# Patient Record
Sex: Female | Born: 1955 | Race: White | Hispanic: No | Smoking: Never smoker
Health system: Southern US, Community
[De-identification: ages and names within clinical notes are randomized; demographics above are authoritative.]

## PROBLEM LIST (undated history)

## (undated) DIAGNOSIS — F32A Depression, unspecified: Secondary | ICD-10-CM

## (undated) DIAGNOSIS — E739 Lactose intolerance, unspecified: Secondary | ICD-10-CM

## (undated) DIAGNOSIS — I10 Essential (primary) hypertension: Secondary | ICD-10-CM

## (undated) DIAGNOSIS — R011 Cardiac murmur, unspecified: Secondary | ICD-10-CM

## (undated) DIAGNOSIS — J45909 Unspecified asthma, uncomplicated: Secondary | ICD-10-CM

## (undated) DIAGNOSIS — F319 Bipolar disorder, unspecified: Secondary | ICD-10-CM

## (undated) DIAGNOSIS — G5762 Lesion of plantar nerve, left lower limb: Secondary | ICD-10-CM

## (undated) DIAGNOSIS — Z9109 Other allergy status, other than to drugs and biological substances: Secondary | ICD-10-CM

## (undated) DIAGNOSIS — F329 Major depressive disorder, single episode, unspecified: Secondary | ICD-10-CM

## (undated) HISTORY — DX: Unspecified asthma, uncomplicated: J45.909

## (undated) HISTORY — DX: Major depressive disorder, single episode, unspecified: F32.9

## (undated) HISTORY — DX: Cardiac murmur, unspecified: R01.1

## (undated) HISTORY — DX: Lactose intolerance, unspecified: E73.9

## (undated) HISTORY — DX: Depression, unspecified: F32.A

## (undated) HISTORY — DX: Essential (primary) hypertension: I10

## (undated) HISTORY — DX: Other allergy status, other than to drugs and biological substances: Z91.09

## (undated) HISTORY — DX: Bipolar disorder, unspecified: F31.9

## (undated) HISTORY — DX: Lesion of plantar nerve, left lower limb: G57.62

---

## 2004-05-23 ENCOUNTER — Emergency Department (HOSPITAL_COMMUNITY): Admission: EM | Admit: 2004-05-23 | Discharge: 2004-05-23 | Payer: Self-pay | Admitting: Emergency Medicine

## 2004-06-25 ENCOUNTER — Emergency Department (HOSPITAL_COMMUNITY): Admission: EM | Admit: 2004-06-25 | Discharge: 2004-06-26 | Payer: Self-pay | Admitting: Emergency Medicine

## 2004-11-13 ENCOUNTER — Emergency Department (HOSPITAL_COMMUNITY): Admission: EM | Admit: 2004-11-13 | Discharge: 2004-11-13 | Payer: Self-pay | Admitting: Emergency Medicine

## 2004-12-17 ENCOUNTER — Emergency Department (HOSPITAL_COMMUNITY): Admission: EM | Admit: 2004-12-17 | Discharge: 2004-12-18 | Payer: Self-pay | Admitting: Emergency Medicine

## 2005-08-14 ENCOUNTER — Ambulatory Visit (HOSPITAL_COMMUNITY): Admission: RE | Admit: 2005-08-14 | Discharge: 2005-08-14 | Payer: Self-pay | Admitting: Family Medicine

## 2005-10-13 ENCOUNTER — Ambulatory Visit (HOSPITAL_COMMUNITY): Admission: RE | Admit: 2005-10-13 | Discharge: 2005-10-13 | Payer: Self-pay | Admitting: Family Medicine

## 2007-03-06 ENCOUNTER — Ambulatory Visit (HOSPITAL_COMMUNITY): Admission: RE | Admit: 2007-03-06 | Discharge: 2007-03-06 | Payer: Self-pay | Admitting: Podiatry

## 2007-03-06 ENCOUNTER — Encounter (INDEPENDENT_AMBULATORY_CARE_PROVIDER_SITE_OTHER): Payer: Self-pay | Admitting: Podiatry

## 2009-08-10 ENCOUNTER — Ambulatory Visit (HOSPITAL_COMMUNITY): Admission: RE | Admit: 2009-08-10 | Discharge: 2009-08-10 | Payer: Self-pay | Admitting: Family Medicine

## 2009-09-09 DIAGNOSIS — R05 Cough: Secondary | ICD-10-CM

## 2009-09-09 DIAGNOSIS — F319 Bipolar disorder, unspecified: Secondary | ICD-10-CM | POA: Insufficient documentation

## 2009-09-09 DIAGNOSIS — R059 Cough, unspecified: Secondary | ICD-10-CM | POA: Insufficient documentation

## 2009-09-09 DIAGNOSIS — J309 Allergic rhinitis, unspecified: Secondary | ICD-10-CM | POA: Insufficient documentation

## 2009-10-06 ENCOUNTER — Telehealth: Payer: Self-pay | Admitting: Internal Medicine

## 2010-03-27 HISTORY — PX: LAMINECTOMY: SHX219

## 2010-04-26 NOTE — Progress Notes (Signed)
Summary: nos appt  Phone Note Call from Patient   Caller: [email or web address]  Call For: Yamilee Harmes Summary of Call: LMTCB x2 to rsc nos from 7/12. Initial call taken by: Darletta Moll,  October 06, 2009 3:42 PM

## 2010-06-01 ENCOUNTER — Other Ambulatory Visit (HOSPITAL_COMMUNITY): Payer: Self-pay | Admitting: Orthopaedic Surgery

## 2010-06-01 DIAGNOSIS — M545 Low back pain, unspecified: Secondary | ICD-10-CM

## 2010-06-01 DIAGNOSIS — M1990 Unspecified osteoarthritis, unspecified site: Secondary | ICD-10-CM

## 2010-06-02 ENCOUNTER — Ambulatory Visit (HOSPITAL_COMMUNITY)
Admission: RE | Admit: 2010-06-02 | Discharge: 2010-06-02 | Disposition: A | Discharge status: Home / Self Care | Payer: BC Managed Care – PPO | Source: Ambulatory Visit | Attending: Orthopaedic Surgery | Admitting: Orthopaedic Surgery

## 2010-06-02 DIAGNOSIS — M545 Low back pain, unspecified: Secondary | ICD-10-CM

## 2010-06-02 DIAGNOSIS — M5126 Other intervertebral disc displacement, lumbar region: Secondary | ICD-10-CM | POA: Insufficient documentation

## 2010-06-02 DIAGNOSIS — R209 Unspecified disturbances of skin sensation: Secondary | ICD-10-CM | POA: Insufficient documentation

## 2010-06-02 DIAGNOSIS — M1990 Unspecified osteoarthritis, unspecified site: Secondary | ICD-10-CM

## 2010-06-14 ENCOUNTER — Other Ambulatory Visit: Payer: Self-pay | Admitting: Neurosurgery

## 2010-06-14 DIAGNOSIS — M5126 Other intervertebral disc displacement, lumbar region: Secondary | ICD-10-CM

## 2010-06-14 DIAGNOSIS — M5416 Radiculopathy, lumbar region: Secondary | ICD-10-CM

## 2010-06-16 ENCOUNTER — Other Ambulatory Visit: Payer: BC Managed Care – PPO

## 2010-08-09 NOTE — Op Note (Signed)
[name], [name]             ACCOUNT NO.:  [id number]   MEDICAL RECORD NO.:  [id number]          PATIENT TYPE:  AMB   LOCATION:  DAY                           FACILITY:  [facility]   PHYSICIAN:  Cody M. Ulice Brilliant, D.P.M.  DATE OF BIRTH:  [birth year]   DATE OF PROCEDURE:  03/06/2007  DATE OF DISCHARGE:                               OPERATIVE REPORT   PREOPERATIVE DIAGNOSIS:  Morton's neuroma, third web space, left foot.   POSTOPERATIVE DIAGNOSIS:  Morton's neuroma, third web space, left foot.   PROCEDURE PERFORMED:  Excision of neuroma, third web space, left foot.   SURGEON:  Denny Peon. Ulice Brilliant, D.P.M.   ANESTHESIA:  Monitored anesthesia care.   INDICATIONS FOR SURGERY:  Painful, longstanding neuroma symptoms  originating from the third web space to the left foot, which has been  treated conservatively with injection therapy and increasing in the  width of the shoe gear.  She relates constant discomfort in this area  exacerbated by long periods of standing and walking.   DESCRIPTION OF PROCEDURE:  [name] is brought into the OR and placed on  the table in a supine position.  IV sedation is established.  A  posterior tibial nerve block is then performed about the left ankle.  Further local anesthesia is administered dorsal in the web spaces of  this left foot.  A pneumatic ankle tourniquet is then applied across her  left ankle.  Her foot is then prepped and draped in the usual aseptic  fashion.  An Ace bandage is utilized to exsanguinate the left foot.  The  tourniquet is then inflated to 250 mmHg.   PROCEDURE:  Excision of neuroma, third web space left foot.  Attention  is directed to this third web space at the left foot.  A 5 cm dorsal  linear skin incision is made from this web space, extending over the  dorsal aspect of the foot proximal.  The incision is deepened by a sharp  and blunt dissection through subcutaneous tissues into the third web  space.  The deep transverse and the  metatarsal ligament is identified,  isolated, and severed, utilizing Metzenbaum scissors.  The neuroma at  this point is well visualized.  It's digital branches are traced out  onto the medial aspect of the fourth toe and the lateral aspect of the  third toe.  These are then transected to this level.  The neuroma is  then undermined, retracted proximally, and then delivered from the wound  proximal to the metatarsal heads.  The wound is flushed with copious  amounts of irrigant.  Subcutaneous tissues are reapproximated and closed  with 4-0 Vicryl.  Skin is then reapproximated and closed in a  subcuticular fashion with 4-0 Vicryl.  Steri-Strips are applied across  the wound.  A postoperative injection of Marcaine and Hexadrol is  dispensed.  A Betadine-soaked Adaptic dressing and a dry, sterile  compressive dressing follows.   Ms. Behan tolerates the anesthesia and procedure well.  She is  transported to day hospital following deflation of the tourniquet and  placing her on the stretcher.  She  will be seen in six days for first  postop visit.  Instructions are given on ice and elevation.  A  prescription for Lorcet Plus and Phenergan is dispensed.           ______________________________  Denny Peon. Ulice Brilliant, D.P.M.     CMD/MEDQ  D:  03/06/2007  T:  03/06/2007  Job:  [id number]

## 2010-08-09 NOTE — H&P (Signed)
Valerie [name], Valerie [name]             ACCOUNT NO.:  [id number]   MEDICAL RECORD NO.:  [id number]          PATIENT TYPE:  AMB   LOCATION:  DAY                           FACILITY:  [facility]   PHYSICIAN:  Cody M. Ulice Brilliant, D.P.M.  DATE OF BIRTH:  1955/12/19   DATE OF ADMISSION:  03/06/2007  DATE OF DISCHARGE:  [facility]                              HISTORY & PHYSICAL   Greater than 2-year history of uncomfortable left forefoot.  The patient  has been treated conservatively with injection therapy in the third web  space of the left foot for the diagnosis of Morton's neuroma.  The  patient will get better with the injection therapy, only to have it  return several months later.  She is at the point now where all enclosed  shoes except for her widest shoes are uncomfortable.  She relates that  she is limited on how long she can walk or work because of pain  radiating into the third and fourth digits.   PAST MEDICAL HISTORY:  Essentially significant for anxiety.  She is on  Xanax at this point.   She has an allergy to DEPO-MEDROL, PENICILLINS.   She is a nonsmoker, occasional alcohol drinker.  She works as a Automotive engineer.   OBJECTIVE:  A very slight white female, noted to have discomfort to deep  palpation to the third web space of this left foot.   ASSESSMENT:  Neuroma, third web space left foot.   PLAN:  Surgical correction will consist of an excision of this neuroma.  I have described the procedure to Valerie [name].  She has read her consent form,  apparently understood, and signed.                                            ______________________________  Denny Peon. Ulice Brilliant, D.P.M.     CMD/MEDQ  D:  03/05/2007  T:  03/06/2007  Job:  [id number]

## 2010-08-18 ENCOUNTER — Other Ambulatory Visit (HOSPITAL_COMMUNITY): Payer: Self-pay | Admitting: Neurosurgery

## 2010-08-18 DIAGNOSIS — IMO0002 Reserved for concepts with insufficient information to code with codable children: Secondary | ICD-10-CM

## 2010-08-24 ENCOUNTER — Emergency Department (HOSPITAL_COMMUNITY)
Admission: EM | Admit: 2010-08-24 | Discharge: 2010-08-25 | Disposition: A | Discharge status: Home / Self Care | Payer: BC Managed Care – PPO | Attending: Emergency Medicine | Admitting: Emergency Medicine

## 2010-08-24 DIAGNOSIS — I10 Essential (primary) hypertension: Secondary | ICD-10-CM | POA: Insufficient documentation

## 2010-08-24 DIAGNOSIS — R51 Headache: Secondary | ICD-10-CM | POA: Insufficient documentation

## 2010-08-24 LAB — DIFFERENTIAL
Basophils Absolute: 0.1 10*3/uL (ref 0.0–0.1)
Eosinophils Relative: 1 % (ref 0–5)
Lymphs Abs: 2.5 10*3/uL (ref 0.7–4.0)
Monocytes Absolute: 0.6 10*3/uL (ref 0.1–1.0)
Monocytes Relative: 7 % (ref 3–12)
Neutrophils Relative %: 59 % (ref 43–77)

## 2010-08-24 LAB — CBC
Hemoglobin: 12.8 g/dL (ref 12.0–15.0)
MCH: 31.1 pg (ref 26.0–34.0)
MCHC: 33.3 g/dL (ref 30.0–36.0)
WBC: 7.6 10*3/uL (ref 4.0–10.5)

## 2010-08-24 LAB — BASIC METABOLIC PANEL
Chloride: 101 mEq/L (ref 96–112)
GFR calc non Af Amer: >60 mL/min (ref >60)
Glucose, Bld: 93 mg/dL (ref 70–99)
Potassium: 3.9 mEq/L (ref 3.5–5.1)

## 2010-08-25 ENCOUNTER — Ambulatory Visit (HOSPITAL_COMMUNITY)
Admission: RE | Admit: 2010-08-25 | Discharge: 2010-08-25 | Disposition: A | Discharge status: Home / Self Care | Payer: BC Managed Care – PPO | Source: Ambulatory Visit | Attending: Neurosurgery | Admitting: Neurosurgery

## 2010-08-25 DIAGNOSIS — M545 Low back pain, unspecified: Secondary | ICD-10-CM | POA: Insufficient documentation

## 2010-08-25 DIAGNOSIS — IMO0002 Reserved for concepts with insufficient information to code with codable children: Secondary | ICD-10-CM

## 2010-08-25 DIAGNOSIS — M5126 Other intervertebral disc displacement, lumbar region: Secondary | ICD-10-CM | POA: Insufficient documentation

## 2010-08-25 DIAGNOSIS — M25559 Pain in unspecified hip: Secondary | ICD-10-CM | POA: Insufficient documentation

## 2010-08-25 LAB — URINALYSIS, ROUTINE W REFLEX MICROSCOPIC
Leukocytes, UA: NEGATIVE
Nitrite: NEGATIVE
Protein, ur: NEGATIVE mg/dL

## 2010-08-25 LAB — URINE MICROSCOPIC-ADD ON

## 2010-09-09 ENCOUNTER — Other Ambulatory Visit (HOSPITAL_COMMUNITY): Payer: Self-pay | Admitting: Cardiovascular Disease

## 2010-09-09 DIAGNOSIS — R519 Headache, unspecified: Secondary | ICD-10-CM

## 2010-09-13 ENCOUNTER — Ambulatory Visit (HOSPITAL_COMMUNITY): Payer: BC Managed Care – PPO

## 2010-09-13 ENCOUNTER — Ambulatory Visit (HOSPITAL_COMMUNITY): Admission: RE | Admit: 2010-09-13 | Payer: BC Managed Care – PPO | Source: Ambulatory Visit

## 2010-10-07 ENCOUNTER — Other Ambulatory Visit (HOSPITAL_COMMUNITY): Payer: BC Managed Care – PPO

## 2010-10-07 ENCOUNTER — Ambulatory Visit (HOSPITAL_COMMUNITY)
Admission: RE | Admit: 2010-10-07 | Discharge: 2010-10-07 | Disposition: A | Discharge status: Home / Self Care | Payer: BC Managed Care – PPO | Source: Ambulatory Visit | Attending: Cardiovascular Disease | Admitting: Cardiovascular Disease

## 2010-10-07 ENCOUNTER — Ambulatory Visit (HOSPITAL_COMMUNITY): Payer: BC Managed Care – PPO

## 2010-10-07 DIAGNOSIS — R519 Headache, unspecified: Secondary | ICD-10-CM

## 2010-10-07 DIAGNOSIS — M5030 Other cervical disc degeneration, unspecified cervical region: Secondary | ICD-10-CM | POA: Insufficient documentation

## 2010-10-07 DIAGNOSIS — M542 Cervicalgia: Secondary | ICD-10-CM | POA: Insufficient documentation

## 2010-10-07 DIAGNOSIS — R51 Headache: Secondary | ICD-10-CM | POA: Insufficient documentation

## 2011-01-02 ENCOUNTER — Other Ambulatory Visit (HOSPITAL_COMMUNITY): Payer: Self-pay | Admitting: Internal Medicine

## 2011-01-02 DIAGNOSIS — Z139 Encounter for screening, unspecified: Secondary | ICD-10-CM

## 2011-01-02 LAB — BASIC METABOLIC PANEL
Calcium: 8.8
Chloride: 106
GFR calc Af Amer: >60
GFR calc non Af Amer: >60
Potassium: 3.7
Sodium: 139

## 2011-01-02 LAB — HEMOGLOBIN AND HEMATOCRIT, BLOOD: HCT: 35.6 — ABNORMAL LOW

## 2011-01-05 ENCOUNTER — Ambulatory Visit (HOSPITAL_COMMUNITY): Payer: BC Managed Care – PPO

## 2011-01-24 ENCOUNTER — Ambulatory Visit (HOSPITAL_COMMUNITY): Payer: BC Managed Care – PPO

## 2011-02-14 ENCOUNTER — Ambulatory Visit (HOSPITAL_COMMUNITY): Payer: BC Managed Care – PPO

## 2011-02-23 ENCOUNTER — Ambulatory Visit (HOSPITAL_COMMUNITY)
Admission: RE | Admit: 2011-02-23 | Discharge: 2011-02-23 | Disposition: A | Discharge status: Home / Self Care | Payer: PRIVATE HEALTH INSURANCE | Source: Ambulatory Visit | Attending: Internal Medicine | Admitting: Internal Medicine

## 2011-02-23 DIAGNOSIS — Z1231 Encounter for screening mammogram for malignant neoplasm of breast: Secondary | ICD-10-CM | POA: Insufficient documentation

## 2011-02-23 DIAGNOSIS — Z139 Encounter for screening, unspecified: Secondary | ICD-10-CM

## 2011-05-25 ENCOUNTER — Ambulatory Visit (HOSPITAL_COMMUNITY)
Admission: RE | Admit: 2011-05-25 | Discharge: 2011-05-25 | Disposition: A | Discharge status: Home / Self Care | Payer: PRIVATE HEALTH INSURANCE | Source: Ambulatory Visit | Attending: Neurosurgery | Admitting: Neurosurgery

## 2011-05-25 DIAGNOSIS — IMO0001 Reserved for inherently not codable concepts without codable children: Secondary | ICD-10-CM | POA: Insufficient documentation

## 2011-05-25 DIAGNOSIS — M545 Low back pain, unspecified: Secondary | ICD-10-CM | POA: Insufficient documentation

## 2011-05-25 DIAGNOSIS — M6281 Muscle weakness (generalized): Secondary | ICD-10-CM | POA: Insufficient documentation

## 2012-06-06 ENCOUNTER — Institutional Professional Consult (permissible substitution): Payer: PRIVATE HEALTH INSURANCE | Admitting: Pulmonary Disease

## 2012-07-08 ENCOUNTER — Encounter: Payer: Self-pay | Admitting: Pulmonary Disease

## 2012-07-09 ENCOUNTER — Institutional Professional Consult (permissible substitution): Payer: PRIVATE HEALTH INSURANCE | Admitting: Pulmonary Disease

## 2012-07-26 ENCOUNTER — Ambulatory Visit
Admission: RE | Admit: 2012-07-26 | Discharge: 2012-07-26 | Disposition: A | Discharge status: Home / Self Care | Payer: PRIVATE HEALTH INSURANCE | Source: Ambulatory Visit | Attending: Allergy and Immunology | Admitting: Allergy and Immunology

## 2012-07-26 ENCOUNTER — Other Ambulatory Visit: Payer: Self-pay | Admitting: Allergy and Immunology

## 2012-07-26 DIAGNOSIS — J45901 Unspecified asthma with (acute) exacerbation: Secondary | ICD-10-CM

## 2013-07-10 ENCOUNTER — Ambulatory Visit (INDEPENDENT_AMBULATORY_CARE_PROVIDER_SITE_OTHER): Payer: PRIVATE HEALTH INSURANCE | Admitting: Otolaryngology

## 2013-09-29 ENCOUNTER — Other Ambulatory Visit (HOSPITAL_COMMUNITY): Payer: Self-pay | Admitting: Family Medicine

## 2013-09-29 DIAGNOSIS — Z139 Encounter for screening, unspecified: Secondary | ICD-10-CM

## 2013-10-01 ENCOUNTER — Ambulatory Visit (HOSPITAL_COMMUNITY)
Admission: RE | Admit: 2013-10-01 | Discharge: 2013-10-01 | Disposition: A | Discharge status: Home / Self Care | Payer: BC Managed Care – PPO | Source: Ambulatory Visit | Attending: Family Medicine | Admitting: Family Medicine

## 2013-10-01 DIAGNOSIS — Z139 Encounter for screening, unspecified: Secondary | ICD-10-CM

## 2013-10-01 DIAGNOSIS — Z1231 Encounter for screening mammogram for malignant neoplasm of breast: Secondary | ICD-10-CM | POA: Insufficient documentation

## 2013-10-06 ENCOUNTER — Other Ambulatory Visit: Payer: Self-pay | Admitting: Family Medicine

## 2013-10-06 DIAGNOSIS — R928 Other abnormal and inconclusive findings on diagnostic imaging of breast: Secondary | ICD-10-CM

## 2013-10-07 ENCOUNTER — Encounter (HOSPITAL_COMMUNITY): Payer: PRIVATE HEALTH INSURANCE

## 2013-10-08 ENCOUNTER — Other Ambulatory Visit: Payer: Self-pay | Admitting: Family Medicine

## 2013-10-08 DIAGNOSIS — R928 Other abnormal and inconclusive findings on diagnostic imaging of breast: Secondary | ICD-10-CM

## 2013-10-16 ENCOUNTER — Ambulatory Visit
Admission: RE | Admit: 2013-10-16 | Discharge: 2013-10-16 | Disposition: A | Discharge status: Home / Self Care | Payer: BC Managed Care – PPO | Source: Ambulatory Visit | Attending: Family Medicine | Admitting: Family Medicine

## 2013-10-16 DIAGNOSIS — R928 Other abnormal and inconclusive findings on diagnostic imaging of breast: Secondary | ICD-10-CM

## 2014-03-27 DIAGNOSIS — R011 Cardiac murmur, unspecified: Secondary | ICD-10-CM

## 2014-03-27 HISTORY — DX: Cardiac murmur, unspecified: R01.1

## 2014-10-30 ENCOUNTER — Other Ambulatory Visit (HOSPITAL_COMMUNITY): Payer: Self-pay | Admitting: Family Medicine

## 2014-10-30 DIAGNOSIS — Z1231 Encounter for screening mammogram for malignant neoplasm of breast: Secondary | ICD-10-CM

## 2014-11-09 ENCOUNTER — Ambulatory Visit (HOSPITAL_COMMUNITY): Payer: Self-pay

## 2015-07-22 ENCOUNTER — Emergency Department (HOSPITAL_COMMUNITY): Admission: EM | Admit: 2015-07-22 | Discharge: 2015-07-22 | Discharge status: Absent without leave | Payer: Self-pay

## 2015-08-12 DIAGNOSIS — I10 Essential (primary) hypertension: Secondary | ICD-10-CM

## 2015-08-17 NOTE — Congregational Nurse Program (Signed)
Congregational Nurse Program Note  Date of Encounter: 08/12/2015  Past Medical History: Past Medical History  Diagnosis Date  . Diabetes   . Hyperlipemia   . Bell's palsy   . GERD (gastroesophageal reflux disease)   . OSA (obstructive sleep apnea)   . Depression   . Hypertension   . Asthma     Encounter Details:     CNP Questionnaire - 08/12/15 1530    Patient Demographics   Is this a new or existing patient? New   Patient is considered a/an Not Applicable   Race Caucasian/White   Patient Assistance   Location of Patient Assistance [facility] [place]   Patient's financial/insurance status Low Income   Uninsured Patient Yes   Interventions Counseled to make appt. with provider;Assisted patient in making appt.;Averted from ED/Urgent Care   Patient referred to apply for the following financial assistance Not Applicable   Food insecurities addressed Not Applicable   Transportation assistance No   Educational health offerings Chronic disease;Hypertension;Medications;Navigating the healthcare system   Encounter Details   Primary purpose of visit Chronic Illness/Condition Visit;Navigating the Healthcare System;Education/Health Concerns;Other   Was an Emergency Department visit averted? Yes   Does patient have a medical provider? Yes  yes, but without insurance or income at this time she has not been in several months and cannot afford copay   Patient referred to Area Agency;Clinic;Follow up with established PCP   Was a mental health screening completed? (GAINS tool) No   Does patient have dental issues? No   Does patient have vision issues? No   Does your patient have an abnormal blood pressure today? No   Since previous encounter, have you referred patient for abnormal blood pressure that resulted in a new diagnosis or medication change? No   Does your patient have an abnormal blood glucose today? No   Since previous encounter, have you referred patient for abnormal  blood glucose that resulted in a new diagnosis or medication change? No   Was there a life-saving intervention made? No      [facility] . Client states that she has lost her employment as of a month ago and has not been able to pay for her medications which per patient are Dilitizem ER 90mg  and Montelukast 10mg . She states [facility] . She states she has 9 days left of her of blood pressure medication. She is very emotional, denies mental health treatment , denies thoughts of suicide. States no prior suicide attempts.  Explained to client that we could refer her into a medical provider without insurance if she chooses . The cost to refill her current medications for 30 days is 127.00$ To fill just the Dilitizem ER will cost 106.96 and a partial fill will cost 62.00$ Explained to client that our program has limits on amounts we are able to do a one time assistance. RN will check on other resources available and follow up with client. [facility] [place] [facility] [place] [facility] since she is not employed at this time and does not have Programmer, applicationshealth insurance. Counseled client to make the clinic aware if she finds employment and if she receives health benefits through that job. Client states understanding and appointment made with the [facility] for May 24 th, 2017 at [facility]. RN will follow up with client at that time regarding any available medication assistance.

## 2015-08-18 ENCOUNTER — Encounter: Payer: Self-pay | Admitting: Physician Assistant

## 2015-08-18 ENCOUNTER — Ambulatory Visit: Payer: Self-pay | Admitting: Physician Assistant

## 2015-08-18 VITALS — BP 118/72 | HR 90 | Temp 97.5°F | Ht 62.25 in | Wt 117.0 lb

## 2015-08-18 DIAGNOSIS — Z131 Encounter for screening for diabetes mellitus: Secondary | ICD-10-CM

## 2015-08-18 DIAGNOSIS — Z13220 Encounter for screening for lipoid disorders: Secondary | ICD-10-CM

## 2015-08-18 DIAGNOSIS — I10 Essential (primary) hypertension: Secondary | ICD-10-CM

## 2015-08-18 DIAGNOSIS — F3160 Bipolar disorder, current episode mixed, unspecified: Secondary | ICD-10-CM | POA: Insufficient documentation

## 2015-08-18 DIAGNOSIS — Z1211 Encounter for screening for malignant neoplasm of colon: Secondary | ICD-10-CM

## 2015-08-18 DIAGNOSIS — Z1239 Encounter for other screening for malignant neoplasm of breast: Secondary | ICD-10-CM

## 2015-08-18 DIAGNOSIS — J302 Other seasonal allergic rhinitis: Secondary | ICD-10-CM | POA: Insufficient documentation

## 2015-08-18 LAB — GLUCOSE, POCT (MANUAL RESULT ENTRY): POC Glucose: 100 mg/dl — AB (ref 70–99)

## 2015-08-18 MED ORDER — MONTELUKAST SODIUM 10 MG PO TABS: 10.0000 mg | ORAL_TABLET | Freq: Every day | ORAL | Status: DC

## 2015-08-18 MED ORDER — DILTIAZEM HCL ER 90 MG PO CP12: 90.0000 mg | ORAL_CAPSULE | Freq: Every day | ORAL | Status: DC

## 2015-08-18 NOTE — Progress Notes (Signed)
BP 118/72 mmHg  Pulse 90  Temp(Src) 97.5 F (36.4 C)  Ht 5' 2.25" (1.581 m)  Wt 117 lb (53.071 kg)  BMI 21.23 kg/m2  SpO2 98%   Subjective:    Patient ID: Valerie [name], female    DOB: 10-25-1955, 60 y.o.   MRN: [id number]  HPI: Valerie [name] is a 60 y.o. female presenting on 08/18/2015 for New Patient (Initial Visit); Nasal Congestion; Depression; and Mass   HPI Chief Complaint  Patient presents with  . New Patient (Initial Visit)    [facility] patient [facility]. 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 Nasal Congestion    pt states it is on and off.  . Depression  . Mass    thinks it is a bug bite. on L side of neck    Pt thinks she hasn't been to [facility] in 6months -1 year.  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  Pt has not seen her psychiatrist in several years.  Pt says she tried "like 9 other drugs" for her htn and she couldn't tolerate any of them except for the diltiazem.  Pt says she has been on this for 3 or 4 years.  She says she has 2 doses left.  [facility]. She brought in the bottle and I confirmed that she is taking the diltaizem SR 90mg  qhs.  Asked pt if she ever took this bid and she says no, she always took it only qhs.  Pt says she has never had a colonoscopy b/c she didn't want one.  Last mammogram was 2 year ago.  Pt states her Last pap was a year or two ago  Relevant past medical, surgical, family and social history reviewed and updated as indicated. Interim medical history since our last visit reviewed. Allergies and medications reviewed and updated.   Current outpatient prescriptions:  .  diltiazem (CARDIZEM) 90 MG tablet, Take 90 mg by mouth at bedtime., Disp: , Rfl:  .  montelukast (SINGULAIR) 10 MG tablet, Take 10 mg by mouth at bedtime., Disp: , Rfl:    Review of Systems  Constitutional: Negative for fever, chills, diaphoresis, appetite change, fatigue and unexpected weight change.   HENT: Positive for congestion and sneezing. Negative for dental problem, drooling, ear pain, facial swelling, hearing loss, mouth sores, sore throat, trouble swallowing and voice change.   Eyes: Negative for pain, discharge, redness, itching and visual disturbance.  Respiratory: Positive for cough. Negative for choking, shortness of breath and wheezing.   Cardiovascular: Negative for chest pain, palpitations and leg swelling.  Gastrointestinal: Negative for vomiting, abdominal pain, diarrhea, constipation and blood in stool.  Endocrine: Negative for cold intolerance, heat intolerance and polydipsia.  Genitourinary: Negative for dysuria, hematuria and decreased urine volume.  Musculoskeletal: Positive for back pain. Negative for arthralgias and gait problem.  Skin: Negative for rash.  Allergic/Immunologic: Positive for environmental allergies.  Neurological: Positive for headaches. Negative for seizures, syncope and light-headedness.  Hematological: Negative for adenopathy.  Psychiatric/Behavioral: Positive for dysphoric mood. Negative for suicidal ideas and agitation. The patient is not nervous/anxious.     Per HPI unless specifically indicated above     Objective:    BP 118/72 mmHg  Pulse 90  Temp(Src) 97.5 F (36.4 C)  Ht 5' 2.25" (1.581 m)  Wt 117 lb (53.071 kg)  BMI 21.23 kg/m2  SpO2 98%  Wt Readings from Last 3 Encounters:  08/18/15 117 lb (53.071  kg)    Physical Exam  Constitutional: She is oriented to person, place, and time. She appears well-developed and well-nourished.  HENT:  Head: Normocephalic and atraumatic.  Mouth/Throat: Oropharynx is clear and moist. No oropharyngeal exudate.  Eyes: Conjunctivae and EOM are normal. Pupils are equal, round, and reactive to light.  Neck: Neck supple. No thyromegaly present.  Cardiovascular: Normal rate and regular rhythm.   Pulmonary/Chest: Effort normal and breath sounds normal.  Abdominal: Soft. Bowel sounds are normal. She  exhibits no mass. There is no hepatosplenomegaly. There is no tenderness.  Musculoskeletal: She exhibits no edema.  Lymphadenopathy:    She has no cervical adenopathy.  Neurological: She is alert and oriented to person, place, and time. Gait normal.  Skin: Skin is warm and dry. Lesion (small solitary papule L neck) noted.  Psychiatric: She has a normal mood and affect. Her behavior is normal.  Vitals reviewed.   Results for orders placed or performed in visit on 08/18/15  POCT Glucose (CBG)  Result Value Ref Range   POC Glucose 100 (A) 70 - 99 mg/dl      Assessment & Plan:   Encounter Diagnoses  Name Primary?  . Essential hypertension Yes  . Bipolar affective disorder, current episode mixed, current episode severity unspecified (HCC)   . Seasonal allergies   . Screening for diabetes mellitus   . Screening for breast cancer   . Screening cholesterol level   . Special screening for malignant neoplasms, colon     -urged pt to get back Counseling- gave contact information and recommended she call [facility] -order screening Mammogram -pt will get Baseline labs drawn this week -Gave ifobt for colon cancer screening -Reassured pt that bump on neck is benign and should heal up if left alone -order singulair from [facility]/get pt signed up for [facility] -discussed BP medication at length with pt.  The specific Rx and dosage that she is on now is not available through any pt assistance programs nor are there any coupons for it.  It is a very expensive medication and she admits that she cannot afford to pay for it.  Asked if she is willing to try some other medication for this and she starts to cry.  Record [facility] to [facility] b/c pt states that they have "a list" of the medications that she has tried in the past and was intolerant of.  She says that she might be willing to try something if it isn't something she already was on -f/u OV 1 month.  RTO sooner prn

## 2015-08-20 LAB — COMPLETE METABOLIC PANEL WITH GFR
ALBUMIN: 4.4 g/dL (ref 3.6–5.1)
ALK PHOS: 77 U/L (ref 33–130)
ALT: 11 U/L (ref 6–29)
AST: 16 U/L (ref 10–35)
BUN: 14 mg/dL (ref 7–25)
CO2: 30 mmol/L (ref 20–31)
Calcium: 9.3 mg/dL (ref 8.6–10.4)
Chloride: 102 mmol/L (ref 98–110)
Creat: 0.76 mg/dL (ref 0.50–1.05)
GFR, EST NON AFRICAN AMERICAN: 87 mL/min (ref >=60)
GFR, Est African American: >89 mL/min (ref >=60)
GLUCOSE: 93 mg/dL (ref 65–99)
POTASSIUM: 4.4 mmol/L (ref 3.5–5.3)
Sodium: 142 mmol/L (ref 135–146)
Total Bilirubin: 0.8 mg/dL (ref 0.2–1.2)
Total Protein: 7 g/dL (ref 6.1–8.1)

## 2015-08-20 LAB — CBC WITH DIFFERENTIAL/PLATELET
BASOS ABS: 64 {cells}/uL (ref 0–200)
Basophils Relative: 1 %
Eosinophils Absolute: 64 cells/uL (ref 15–500)
Eosinophils Relative: 1 %
HCT: 39 % (ref 35.0–45.0)
HEMOGLOBIN: 13 g/dL (ref 11.7–15.5)
LYMPHS ABS: 1856 {cells}/uL (ref 850–3900)
Lymphocytes Relative: 29 %
MCH: 30.2 pg (ref 27.0–33.0)
MCHC: 33.3 g/dL (ref 32.0–36.0)
MCV: 90.5 fL (ref 80.0–100.0)
MPV: 9.8 fL (ref 7.5–12.5)
Monocytes Absolute: 448 cells/uL (ref 200–950)
Monocytes Relative: 7 %
Neutro Abs: 3968 cells/uL (ref 1500–7800)
Neutrophils Relative %: 62 %
Platelets: 301 10*3/uL (ref 140–400)
RBC: 4.31 MIL/uL (ref 3.80–5.10)
RDW: 13.7 % (ref 11.0–15.0)
WBC: 6.4 10*3/uL (ref 3.8–10.8)

## 2015-08-20 LAB — LIPID PANEL
CHOL/HDL RATIO: 3 ratio (ref <=5.0)
Cholesterol: 198 mg/dL (ref 125–200)
HDL: 65 mg/dL (ref >=46)
LDL CALC: 109 mg/dL (ref <130)
Triglycerides: 119 mg/dL (ref <150)
VLDL: 24 mg/dL (ref <30)

## 2015-08-20 LAB — HEMOGLOBIN A1C
Hgb A1c MFr Bld: 5.7 % — ABNORMAL HIGH (ref <5.7)
MEAN PLASMA GLUCOSE: 117 mg/dL

## 2015-08-25 ENCOUNTER — Encounter (HOSPITAL_COMMUNITY): Payer: Self-pay

## 2015-08-26 ENCOUNTER — Ambulatory Visit: Payer: Self-pay | Admitting: Physician Assistant

## 2015-08-26 ENCOUNTER — Encounter (HOSPITAL_COMMUNITY): Payer: Self-pay

## 2015-08-30 ENCOUNTER — Encounter: Payer: Self-pay | Admitting: Physician Assistant

## 2015-08-30 ENCOUNTER — Ambulatory Visit: Payer: Self-pay | Admitting: Physician Assistant

## 2015-08-30 VITALS — BP 144/86 | HR 85 | Temp 97.9°F | Ht 62.25 in | Wt 118.2 lb

## 2015-08-30 DIAGNOSIS — L290 Pruritus ani: Secondary | ICD-10-CM

## 2015-08-30 DIAGNOSIS — J302 Other seasonal allergic rhinitis: Secondary | ICD-10-CM

## 2015-08-30 LAB — IFOBT (OCCULT BLOOD): IMMUNOLOGICAL FECAL OCCULT BLOOD TEST: NEGATIVE

## 2015-08-30 NOTE — Patient Instructions (Signed)

## 2015-08-30 NOTE — Progress Notes (Signed)
BP 144/86 mmHg  Pulse 85  Temp(Src) 97.9 F (36.6 C)  Ht 5' 2.25" (1.581 m)  Wt 118 lb 3.2 oz (53.615 kg)  BMI 21.45 kg/m2  SpO2 97%   Subjective:    Patient ID: Valerie [name], female    DOB: [birth year], 60 y.o.   MRN: [id number]  HPI: Valerie SkainsSusan D Leming is a 60 y.o. female presenting on 08/30/2015 for Cough and Anal Itching   HPI   Chief Complaint  Patient presents with  . Cough    for about 2 months. has used delsym and it helps a little bit. pt has constant tickle in her throat. pt states she is not coughing up any mucus. thinks it may be due to allergies  . Anal Itching    for about 2 weeks. has tried hydrocotisone cream and it helps a little. pt also used triple antibiotic cream. pt changed laundry detergent and thinks it may be related to it. pt also wonders if she possibly caught something from her sexual partner.    Pt states cough on and off   Relevant past medical, surgical, family and social history reviewed and updated as indicated. Interim medical history since our last visit reviewed. Allergies and medications reviewed and updated.   Current outpatient prescriptions:  .  diltiazem (CARDIZEM SR) 90 MG 12 hr capsule, Take 1 capsule (90 mg total) by mouth at bedtime., Disp: 30 capsule, Rfl: 1 .  montelukast (SINGULAIR) 10 MG tablet, Take 1 tablet (10 mg total) by mouth at bedtime., Disp: 90 tablet, Rfl: 1   Review of Systems  Constitutional: Negative for fever, chills, diaphoresis, appetite change, fatigue and unexpected weight change.  HENT: Positive for congestion and sneezing. Negative for dental problem, drooling, ear pain, facial swelling, hearing loss, mouth sores, sore throat, trouble swallowing and voice change.   Eyes: Negative for pain, discharge, redness, itching and visual disturbance.  Respiratory: Positive for cough and wheezing. Negative for choking and shortness of breath.   Cardiovascular: Negative for chest pain, palpitations and leg  swelling.  Gastrointestinal: Negative for vomiting, abdominal pain, diarrhea, constipation and blood in stool.  Endocrine: Negative for cold intolerance, heat intolerance and polydipsia.  Genitourinary: Negative for dysuria, hematuria and decreased urine volume.  Musculoskeletal: Negative for back pain, arthralgias and gait problem.  Skin: Negative for rash.  Allergic/Immunologic: Positive for environmental allergies.  Neurological: Positive for headaches. Negative for seizures, syncope and light-headedness.  Hematological: Negative for adenopathy.  Psychiatric/Behavioral: Positive for dysphoric mood. Negative for suicidal ideas and agitation. The patient is not nervous/anxious.     Per HPI unless specifically indicated above     Objective:    BP 144/86 mmHg  Pulse 85  Temp(Src) 97.9 F (36.6 C)  Ht 5' 2.25" (1.581 m)  Wt 118 lb 3.2 oz (53.615 kg)  BMI 21.45 kg/m2  SpO2 97%  Wt Readings from Last 3 Encounters:  08/30/15 118 lb 3.2 oz (53.615 kg)  08/18/15 117 lb (53.071 kg)    Physical Exam  Constitutional: She is oriented to person, place, and time. She appears well-developed and well-nourished.  HENT:  Head: Normocephalic and atraumatic.  Right Ear: Hearing, tympanic membrane, external ear and ear canal normal.  Left Ear: Hearing, tympanic membrane, external ear and ear canal normal.  Nose: Nose normal.  Mouth/Throat: Uvula is midline and oropharynx is clear and moist. No oropharyngeal exudate.  Neck: Neck supple.  Cardiovascular: Normal rate and regular rhythm.   Pulmonary/Chest: Effort normal and breath sounds  normal. She has no wheezes.  Abdominal: Soft. Bowel sounds are normal. She exhibits no mass. There is no hepatosplenomegaly. There is no tenderness.  Genitourinary: Rectum normal.  (nurse Berenice assisted)  Musculoskeletal: She exhibits no edema.  Lymphadenopathy:    She has no cervical adenopathy.  Neurological: She is alert and oriented to person, place, and  time.  Skin: Skin is warm and dry.  Psychiatric: She has a normal mood and affect. Her behavior is normal.  Vitals reviewed.       Assessment & Plan:   Encounter Diagnoses  Name Primary?  . Seasonal allergies Yes  . Rectal itching     -Counseled on treatment of rectal area inc OTC ointment like prep H, increasing water PO and increasing fiber -add antihistamine like zyrtec or  claritan -F/u later this month as scheduled.

## 2015-09-02 ENCOUNTER — Ambulatory Visit: Payer: Self-pay | Admitting: Physician Assistant

## 2015-09-02 ENCOUNTER — Encounter (HOSPITAL_COMMUNITY): Payer: Self-pay

## 2015-09-06 ENCOUNTER — Encounter (HOSPITAL_COMMUNITY): Payer: Self-pay

## 2015-09-16 ENCOUNTER — Encounter: Payer: Self-pay | Admitting: Physician Assistant

## 2015-09-16 ENCOUNTER — Ambulatory Visit: Payer: Self-pay | Admitting: Physician Assistant

## 2015-09-16 VITALS — BP 120/86 | HR 83 | Temp 97.9°F | Ht 62.25 in | Wt 116.5 lb

## 2015-09-16 DIAGNOSIS — I10 Essential (primary) hypertension: Secondary | ICD-10-CM

## 2015-09-16 MED ORDER — DILTIAZEM HCL ER 90 MG PO CP12: 90.0000 mg | ORAL_CAPSULE | Freq: Every day | ORAL | Status: DC

## 2015-09-16 MED ORDER — ATENOLOL 25 MG PO TABS: 25.0000 mg | ORAL_TABLET | Freq: Every day | ORAL | Status: DC

## 2015-09-16 NOTE — Progress Notes (Signed)
BP 120/86 mmHg  Pulse 83  Temp(Src) 97.9 F (36.6 C)  Ht 5' 2.25" (1.581 m)  Wt 116 lb 8 oz (52.844 kg)  BMI 21.14 kg/m2  SpO2 97%   Subjective:    Patient ID: Valerie [name], female    DOB: 1955-11-02, 60 y.o.   MRN: [id number]  HPI: Valerie [name] is a 60 y.o. female presenting on 09/16/2015 for Follow-up   HPI    Pt has cancelled 4 appts for mammogram  Relevant past medical, surgical, family and social history reviewed and updated as indicated. Interim medical history since our last visit reviewed. Allergies and medications reviewed and updated.   Current outpatient prescriptions:  .  diltiazem (CARDIZEM SR) 90 MG 12 hr capsule, Take 1 capsule (90 mg total) by mouth at bedtime., Disp: 30 capsule, Rfl: 1 .  montelukast (SINGULAIR) 10 MG tablet, Take 1 tablet (10 mg total) by mouth at bedtime., Disp: 90 tablet, Rfl: 1   Review of Systems  Constitutional: Negative for fever, chills, diaphoresis, appetite change, fatigue and unexpected weight change.  HENT: Positive for congestion and sneezing. Negative for dental problem, drooling, ear pain, facial swelling, hearing loss, mouth sores, sore throat, trouble swallowing and voice change.   Eyes: Negative for pain, discharge, redness, itching and visual disturbance.  Respiratory: Positive for cough. Negative for choking, shortness of breath and wheezing.   Cardiovascular: Negative for chest pain, palpitations and leg swelling.  Gastrointestinal: Negative for vomiting, abdominal pain, diarrhea, constipation and blood in stool.  Endocrine: Negative for cold intolerance, heat intolerance and polydipsia.  Genitourinary: Negative for dysuria, hematuria and decreased urine volume.  Musculoskeletal: Positive for back pain. Negative for arthralgias and gait problem.  Skin: Negative for rash.  Allergic/Immunologic: Positive for environmental allergies.  Neurological: Positive for headaches. Negative for seizures, syncope and  light-headedness.  Hematological: Negative for adenopathy.  Psychiatric/Behavioral: Negative for suicidal ideas, dysphoric mood and agitation. The patient is not nervous/anxious.     Per HPI unless specifically indicated above     Objective:    BP 120/86 mmHg  Pulse 83  Temp(Src) 97.9 F (36.6 C)  Ht 5' 2.25" (1.581 m)  Wt 116 lb 8 oz (52.844 kg)  BMI 21.14 kg/m2  SpO2 97%  Wt Readings from Last 3 Encounters:  09/16/15 116 lb 8 oz (52.844 kg)  08/30/15 118 lb 3.2 oz (53.615 kg)  08/18/15 117 lb (53.071 kg)    Physical Exam  Constitutional: She is oriented to person, place, and time. She appears well-developed and well-nourished.  HENT:  Head: Normocephalic and atraumatic.  Neck: Neck supple.  Cardiovascular: Normal rate and regular rhythm.   Pulmonary/Chest: Effort normal and breath sounds normal.  Abdominal: Soft. Bowel sounds are normal. She exhibits no mass. There is no hepatosplenomegaly. There is no tenderness.  Musculoskeletal: She exhibits no edema.  Lymphadenopathy:    She has no cervical adenopathy.  Neurological: She is alert and oriented to person, place, and time.  Skin: Skin is warm and dry.  Psychiatric: She has a normal mood and affect. Her behavior is normal.  Vitals reviewed.   Results for orders placed or performed in visit on 08/18/15  Lipid Profile  Result Value Ref Range   Cholesterol 198 125 - 200 mg/dL   Triglycerides 119 <150 mg/dL   HDL 65 >=46 mg/dL   Total CHOL/HDL Ratio 3.0 <=5.0 Ratio   VLDL 24 <30 mg/dL   LDL Cholesterol 109 <130 mg/dL  COMPLETE METABOLIC PANEL WITH GFR  Result Value Ref Range   Sodium 142 135 - 146 mmol/L   Potassium 4.4 3.5 - 5.3 mmol/L   Chloride 102 98 - 110 mmol/L   CO2 30 20 - 31 mmol/L   Glucose, Bld 93 65 - 99 mg/dL   BUN 14 7 - 25 mg/dL   Creat 0.76 0.50 - 1.05 mg/dL   Total Bilirubin 0.8 0.2 - 1.2 mg/dL   Alkaline Phosphatase 77 33 - 130 U/L   AST 16 10 - 35 U/L   ALT 11 6 - 29 U/L   Total Protein  7.0 6.1 - 8.1 g/dL   Albumin 4.4 3.6 - 5.1 g/dL   Calcium 9.3 8.6 - 10.4 mg/dL   GFR, Est African American >89 >=60 mL/min   GFR, Est Non African American 87 >=60 mL/min  CBC w/Diff/Platelet  Result Value Ref Range   WBC 6.4 3.8 - 10.8 K/uL   RBC 4.31 3.80 - 5.10 MIL/uL   Hemoglobin 13.0 11.7 - 15.5 g/dL   HCT 39.0 35.0 - 45.0 %   MCV 90.5 80.0 - 100.0 fL   MCH 30.2 27.0 - 33.0 pg   MCHC 33.3 32.0 - 36.0 g/dL   RDW 13.7 11.0 - 15.0 %   Platelets 301 140 - 400 K/uL   MPV 9.8 7.5 - 12.5 fL   Neutro Abs 3968 1500 - 7800 cells/uL   Lymphs Abs 1856 850 - 3900 cells/uL   Monocytes Absolute 448 200 - 950 cells/uL   Eosinophils Absolute 64 15 - 500 cells/uL   Basophils Absolute 64 0 - 200 cells/uL   Neutrophils Relative % 62 %   Lymphocytes Relative 29 %   Monocytes Relative 7 %   Eosinophils Relative 1 %   Basophils Relative 1 %   Smear Review Criteria for review not met   HgB A1c  Result Value Ref Range   Hgb A1c MFr Bld 5.7 (H) <5.7 %   Mean Plasma Glucose 117 mg/dL  POCT Glucose (CBG)  Result Value Ref Range   POC Glucose 100 (A) 70 - 99 mg/dl  IFOBT POC (occult bld, rslt in office)  Result Value Ref Range   IFOBT Negative       Assessment & Plan:   Encounter Diagnosis  Name Primary?  . Essential hypertension Yes     -Reviewed labs with pt  -discussed bp meds with pt again.  Discussed that [place] had NO bp meds listed in her allergies.  Discussed the expensive cardizem that she is taking now versus trying a change to a less costly medication.  She couldn't decide on meds so gave rx atenolol and rx cardizem- she is to choose and take only one or the other, not both -F/u 1 month (if she chooses atenolol- if she stays on cardizem she can f//u in 3 mo)

## 2015-10-07 ENCOUNTER — Ambulatory Visit: Payer: Self-pay | Admitting: Physician Assistant

## 2015-10-07 ENCOUNTER — Encounter: Payer: Self-pay | Admitting: Physician Assistant

## 2015-10-07 VITALS — BP 128/86 | HR 76 | Temp 97.7°F

## 2015-10-07 DIAGNOSIS — I10 Essential (primary) hypertension: Secondary | ICD-10-CM

## 2015-10-07 MED ORDER — ATENOLOL 25 MG PO TABS: 25.0000 mg | ORAL_TABLET | Freq: Every day | ORAL | Status: DC

## 2015-10-07 NOTE — Progress Notes (Signed)
BP 128/86 mmHg  Pulse 76  Temp(Src) 97.7 F (36.5 C)  SpO2 98%   Subjective:    Patient ID: Valerie [name], female    DOB: [birth year], 58 y.o.   MRN: [id number]  HPI: Valerie [name] is a 60 y.o. female presenting on 10/07/2015 for Hypertension   HPI   Pt hopes to find out about a new job in a few days.  She is feeling great.  She gets insurance the first day and optional housing.   Pt is very happy with atenolol.  It is a good price and she says she is having no side effects from it at all.  She was worried due to side effects from many bp meds in the past.  Relevant past medical, surgical, family and social history reviewed and updated as indicated. Interim medical history since our last visit reviewed. Allergies and medications reviewed and updated.   Current outpatient prescriptions:  .  atenolol (TENORMIN) 25 MG tablet, Take 1 tablet (25 mg total) by mouth daily., Disp: 30 tablet, Rfl: 1 .  montelukast (SINGULAIR) 10 MG tablet, Take 1 tablet (10 mg total) by mouth at bedtime., Disp: 90 tablet, Rfl: 1   Review of Systems  Constitutional: Negative for fever, chills, diaphoresis, appetite change, fatigue and unexpected weight change.  HENT: Negative for congestion, dental problem, drooling, ear pain, facial swelling, hearing loss, mouth sores, sneezing, sore throat, trouble swallowing and voice change.   Eyes: Negative for pain, discharge, redness, itching and visual disturbance.  Respiratory: Negative for cough, choking, shortness of breath and wheezing.   Cardiovascular: Negative for chest pain, palpitations and leg swelling.  Gastrointestinal: Negative for vomiting, abdominal pain, diarrhea, constipation and blood in stool.  Endocrine: Negative for cold intolerance, heat intolerance and polydipsia.  Genitourinary: Negative for dysuria, hematuria and decreased urine volume.  Musculoskeletal: Negative for back pain, arthralgias and gait problem.  Skin: Negative for  rash.  Allergic/Immunologic: Negative for environmental allergies.  Neurological: Negative for seizures, syncope, light-headedness and headaches.  Hematological: Negative for adenopathy.  Psychiatric/Behavioral: Negative for suicidal ideas, dysphoric mood and agitation. The patient is not nervous/anxious.     Per HPI unless specifically indicated above     Objective:    BP 128/86 mmHg  Pulse 76  Temp(Src) 97.7 F (36.5 C)  SpO2 98%  Wt Readings from Last 3 Encounters:  09/16/15 116 lb 8 oz (52.844 kg)  08/30/15 118 lb 3.2 oz (53.615 kg)  08/18/15 117 lb (53.071 kg)    Physical Exam  Constitutional: She is oriented to person, place, and time. She appears well-developed and well-nourished.  HENT:  Head: Normocephalic and atraumatic.  Neck: Neck supple.  Cardiovascular: Normal rate and regular rhythm.   Pulmonary/Chest: Effort normal and breath sounds normal.  Abdominal: Soft. Bowel sounds are normal. She exhibits no mass. There is no hepatosplenomegaly. There is no tenderness.  Musculoskeletal: She exhibits no edema.  Lymphadenopathy:    She has no cervical adenopathy.  Neurological: She is alert and oriented to person, place, and time.  Skin: Skin is warm and dry.  Psychiatric: She has a normal mood and affect. Her behavior is normal.  Vitals reviewed.       Assessment & Plan:    Encounter Diagnosis  Name Primary?  . Essential hypertension Yes    -continue atenolol -pt will scheduled f/u in 3 months.  She will cancel the appointment if she gets her new job which includes insurance.  RTO sooner prn

## 2015-10-14 ENCOUNTER — Ambulatory Visit: Payer: Self-pay | Admitting: Physician Assistant

## 2015-11-05 ENCOUNTER — Encounter: Payer: Self-pay | Admitting: Physician Assistant

## 2015-12-15 ENCOUNTER — Other Ambulatory Visit: Payer: Self-pay | Admitting: Physician Assistant

## 2015-12-15 NOTE — Congregational Nurse Program (Unsigned)
Congregational Nurse Program Note  Date of Encounter: 08/12/2015  Past Medical History: Past Medical History:  Diagnosis Date  . Asthma   . Bipolar disorder (HCC)   . Depression   . Environmental allergies   . Heart murmur 2016  . Hypertension   . Lactose intolerance   . Morton's neuroma of left foot     Encounter Details:   Existing client of the [facility] Program. Medication assistance through program x 1. Follow as needed.

## 2015-12-16 ENCOUNTER — Other Ambulatory Visit: Payer: Self-pay | Admitting: Physician Assistant

## 2015-12-16 MED ORDER — MONTELUKAST SODIUM 10 MG PO TABS: 10.0000 mg | ORAL_TABLET | Freq: Every day | ORAL | 1 refills | Status: DC

## 2015-12-29 ENCOUNTER — Ambulatory Visit: Payer: Self-pay | Admitting: Physician Assistant

## 2015-12-29 ENCOUNTER — Encounter: Payer: Self-pay | Admitting: Physician Assistant

## 2015-12-29 VITALS — BP 114/72 | HR 68 | Temp 97.7°F | Ht 62.25 in | Wt 121.2 lb

## 2015-12-29 DIAGNOSIS — M5442 Lumbago with sciatica, left side: Secondary | ICD-10-CM

## 2015-12-29 DIAGNOSIS — I10 Essential (primary) hypertension: Secondary | ICD-10-CM

## 2015-12-29 MED ORDER — PREDNISONE 10 MG PO TABS: ORAL_TABLET | ORAL | 0 refills | Status: AC

## 2015-12-29 NOTE — Progress Notes (Signed)
BP 114/72 (BP Location: Left Arm, Patient Position: Sitting, Cuff Size: Normal)   Pulse 68   Temp 97.7 F (36.5 C)   Ht 5' 2.25" (1.581 m)   Wt 121 lb 4 oz (55 kg)   SpO2 97%   BMI 22.00 kg/m    Subjective:    Patient ID: Valerie SkainsSusan D [name], female    DOB: [birth year], 58 y.o.   MRN: [id number]  HPI: Valerie [name] is a 60 y.o. female presenting on 12/29/2015 for Tingling (Left hand and left foot. pt states she was previously on gabapentin. pt states when she was working she did some lifting and thinks it might be related to that)   HPI   Pt is not working.  She had interview today.    She is not having any tingling today.  She says it comes and goes.  She has tingling L hand and foot and back pain.  She has been using heat and aleve.   Says this has been about a month.  Pt states she lifts people at work and thinks that this is what caused her tingling and pain.  She says she has long history of the same.  Says it comes and goes.  She works as Adult nursephysical therapist.  Relevant past medical, surgical, family and social history reviewed and updated as indicated. Interim medical history since our last visit reviewed. Allergies and medications reviewed and updated.   Current Outpatient Prescriptions:  .  atenolol (TENORMIN) 25 MG tablet, TAKE ONE TABLET BY MOUTH ONCE DAILY, Disp: 30 tablet, Rfl: 1 .  montelukast (SINGULAIR) 10 MG tablet, Take 1 tablet (10 mg total) by mouth at bedtime., Disp: 30 tablet, Rfl: 1   Review of Systems  Constitutional: Negative for appetite change, chills, diaphoresis, fatigue, fever and unexpected weight change.  HENT: Positive for congestion and sneezing. Negative for dental problem, drooling, ear pain, facial swelling, hearing loss, mouth sores, sore throat, trouble swallowing and voice change.   Eyes: Negative for pain, discharge, redness, itching and visual disturbance.  Respiratory: Negative for cough, choking, shortness of breath and wheezing.    Cardiovascular: Negative for chest pain, palpitations and leg swelling.  Gastrointestinal: Negative for abdominal pain, blood in stool, constipation, diarrhea and vomiting.  Endocrine: Positive for cold intolerance. Negative for heat intolerance and polydipsia.  Genitourinary: Negative for decreased urine volume, dysuria and hematuria.  Musculoskeletal: Positive for arthralgias and back pain. Negative for gait problem.  Skin: Negative for rash.  Allergic/Immunologic: Negative for environmental allergies.  Neurological: Negative for seizures, syncope, light-headedness and headaches.  Hematological: Negative for adenopathy.  Psychiatric/Behavioral: Positive for dysphoric mood. Negative for agitation and suicidal ideas. The patient is not nervous/anxious.     Per HPI unless specifically indicated above     Objective:    BP 114/72 (BP Location: Left Arm, Patient Position: Sitting, Cuff Size: Normal)   Pulse 68   Temp 97.7 F (36.5 C)   Ht 5' 2.25" (1.581 m)   Wt 121 lb 4 oz (55 kg)   SpO2 97%   BMI 22.00 kg/m   Wt Readings from Last 3 Encounters:  12/29/15 121 lb 4 oz (55 kg)  09/16/15 116 lb 8 oz (52.8 kg)  08/30/15 118 lb 3.2 oz (53.6 kg)    Physical Exam  Constitutional: She is oriented to person, place, and time. She appears well-developed and well-nourished.  HENT:  Head: Normocephalic and atraumatic.  Neck: Neck supple.  Cardiovascular: Normal rate and regular rhythm.  Pulses:      Radial pulses are 3+ on the left side.       Dorsalis pedis pulses are 3+ on the left side.  Pulmonary/Chest: Effort normal and breath sounds normal.  Abdominal: Soft. Bowel sounds are normal. She exhibits no mass. There is no hepatosplenomegaly. There is no tenderness.  Musculoskeletal: She exhibits no edema.       Left shoulder: Normal.       Left hip: Normal.       Cervical back: Normal.       Thoracic back: Normal.       Lumbar back: Normal.  SLR negative  Lymphadenopathy:    She  has no cervical adenopathy.  Neurological: She is alert and oriented to person, place, and time. She has normal strength. No sensory deficit. Coordination and gait normal.  Good sensation L hand and foot  Skin: Skin is warm and dry.  Psychiatric: She has a normal mood and affect. Her behavior is normal.  Vitals reviewed.       Assessment & Plan:   Encounter Diagnoses  Name Primary?  . Low back pain with left-sided sciatica, unspecified back pain laterality, unspecified chronicity Yes  . Essential hypertension     -pt to continue current BP medications -rx prednisone for back.  She says she has used this in the past and it works well for her.  She says she is not allergic to prednisone- she says she only has problems with methylprednisolone.  Ice/heat to back where hurting -f/u 3 months for BP.  RTO sooner prn

## 2016-01-06 ENCOUNTER — Ambulatory Visit: Payer: Self-pay | Admitting: Physician Assistant

## 2016-01-11 ENCOUNTER — Encounter: Payer: Self-pay | Admitting: Physician Assistant

## 2016-02-14 ENCOUNTER — Other Ambulatory Visit: Payer: Self-pay | Admitting: Physician Assistant

## 2016-03-09 ENCOUNTER — Other Ambulatory Visit: Payer: Self-pay | Admitting: Physician Assistant

## 2016-04-03 ENCOUNTER — Ambulatory Visit: Payer: Self-pay | Admitting: Physician Assistant

## 2016-04-03 ENCOUNTER — Encounter: Payer: Self-pay | Admitting: Physician Assistant

## 2016-04-03 VITALS — BP 120/76 | HR 75 | Temp 97.5°F | Ht 62.25 in | Wt 124.0 lb

## 2016-04-03 DIAGNOSIS — I10 Essential (primary) hypertension: Secondary | ICD-10-CM

## 2016-04-03 DIAGNOSIS — M545 Low back pain: Principal | ICD-10-CM

## 2016-04-03 DIAGNOSIS — G8929 Other chronic pain: Secondary | ICD-10-CM

## 2016-04-03 NOTE — Progress Notes (Signed)
BP 120/76 (BP Location: Left Arm, Patient Position: Sitting, Cuff Size: Normal)   Pulse 75   Temp 97.5 F (36.4 C)   Ht 5' 2.25" (1.581 m)   Wt 124 lb (56.2 kg)   SpO2 98%   BMI 22.50 kg/m    Subjective:    Patient ID: Valerie [name], female    DOB: August 31, 1955, 61 y.o.   MRN: [id number]  HPI: Valerie [name] is a 61 y.o. female presenting on 04/03/2016 for Numbness (on bilateral feet)   HPI   Discussed need for routine appts for HTN.  Pt never scheduled follow up after last appointment  She is working prn/fill-in as physical therapist.  She says she is trying to get full-time position  She says she stll having LBP.  She says at night when she is laying down she says her "feet feel numb but she can still feel them".  She says "they aren't purple or anything".  Says "it is annoying more than anything b/c it makes it hard to sleep".  Pt says she does not have the numb feet sensation during the daytime.   Relevant past medical, surgical, family and social history reviewed and updated as indicated. Interim medical history since our last visit reviewed. Allergies and medications reviewed and updated.   Current Outpatient Prescriptions:  .  atenolol (TENORMIN) 25 MG tablet, TAKE ONE TABLET BY MOUTH ONCE DAILY, Disp: 30 tablet, Rfl: 1 .  montelukast (SINGULAIR) 10 MG tablet, TAKE ONE TABLET BY MOUTH AT BEDTIME, Disp: 30 tablet, Rfl: 0   Review of Systems  Constitutional: Negative for appetite change, chills, diaphoresis, fatigue, fever and unexpected weight change.  HENT: Positive for congestion, dental problem and sneezing. Negative for drooling, ear pain, facial swelling, hearing loss, mouth sores, sore throat, trouble swallowing and voice change.   Eyes: Negative for pain, discharge, redness, itching and visual disturbance.  Respiratory: Negative for cough, choking, shortness of breath and wheezing.   Cardiovascular: Negative for chest pain, palpitations and leg swelling.   Gastrointestinal: Negative for abdominal pain, blood in stool, constipation, diarrhea and vomiting.  Endocrine: Positive for cold intolerance. Negative for heat intolerance and polydipsia.  Genitourinary: Negative for decreased urine volume, dysuria and hematuria.  Musculoskeletal: Positive for arthralgias and back pain. Negative for gait problem.  Skin: Negative for rash.  Allergic/Immunologic: Positive for environmental allergies.  Neurological: Negative for seizures, syncope, light-headedness and headaches.  Hematological: Negative for adenopathy.  Psychiatric/Behavioral: Positive for dysphoric mood. Negative for agitation and suicidal ideas. The patient is not nervous/anxious.     Per HPI unless specifically indicated above     Objective:    BP 120/76 (BP Location: Left Arm, Patient Position: Sitting, Cuff Size: Normal)   Pulse 75   Temp 97.5 F (36.4 C)   Ht 5' 2.25" (1.581 m)   Wt 124 lb (56.2 kg)   SpO2 98%   BMI 22.50 kg/m   Wt Readings from Last 3 Encounters:  04/03/16 124 lb (56.2 kg)  12/29/15 121 lb 4 oz (55 kg)  09/16/15 116 lb 8 oz (52.8 kg)    Physical Exam  Constitutional: She is oriented to person, place, and time. She appears well-developed and well-nourished.  HENT:  Head: Normocephalic and atraumatic.  Neck: Neck supple.  Cardiovascular: Normal rate and regular rhythm.   Pulmonary/Chest: Effort normal and breath sounds normal.  Abdominal: Soft. Bowel sounds are normal. She exhibits no mass. There is no hepatosplenomegaly. There is no tenderness.  Musculoskeletal: She  exhibits no edema.       Lumbar back: She exhibits normal range of motion, no tenderness, no bony tenderness, no swelling, no edema and no deformity.  Lymphadenopathy:    She has no cervical adenopathy.  Neurological: She is alert and oriented to person, place, and time.  Skin: Skin is warm and dry.  Psychiatric: She has a normal mood and affect. Her behavior is normal.  Vitals  reviewed.  Diabetic foot exam recorded- sensation B feet normal     Assessment & Plan:    Encounter Diagnoses  Name Primary?  . Chronic low back pain, unspecified back pain laterality, with sciatica presence unspecified Yes  . Essential hypertension    -Discussed with pt that her foot sensation may be related to her chronic pain.  Recommended she do back exercises to maintain function.  Also encouraged to use pillow under knees in bed to reduce "numb" feeling. -pt to scheduled follow up for 3 months for blood pressure.  RTO sooner prn.  Discussed that she should RTO if she starts having problems with numbness during the day or calf pains

## 2016-04-10 ENCOUNTER — Other Ambulatory Visit: Payer: Self-pay | Admitting: Physician Assistant

## 2016-05-12 ENCOUNTER — Other Ambulatory Visit: Payer: Self-pay | Admitting: Physician Assistant

## 2016-05-22 ENCOUNTER — Ambulatory Visit: Payer: Self-pay | Admitting: Physician Assistant

## 2016-05-22 ENCOUNTER — Encounter: Payer: Self-pay | Admitting: Physician Assistant

## 2016-05-22 VITALS — BP 132/72 | HR 69 | Temp 97.7°F | Ht 62.25 in | Wt 125.0 lb

## 2016-05-22 DIAGNOSIS — G8929 Other chronic pain: Secondary | ICD-10-CM

## 2016-05-22 DIAGNOSIS — M545 Low back pain: Principal | ICD-10-CM

## 2016-05-22 DIAGNOSIS — M79644 Pain in right finger(s): Secondary | ICD-10-CM

## 2016-05-22 MED ORDER — DICLOFENAC SODIUM 75 MG PO TBEC: 75.0000 mg | DELAYED_RELEASE_TABLET | Freq: Two times a day (BID) | ORAL | 0 refills | Status: AC | PRN

## 2016-05-22 NOTE — Progress Notes (Signed)
BP 132/72 (BP Location: Left Arm, Patient Position: Sitting, Cuff Size: Normal)   Pulse 69   Temp 97.7 F (36.5 C)   Ht 5' 2.25" (1.581 m)   Wt 125 lb (56.7 kg)   SpO2 98%   BMI 22.68 kg/m    Subjective:    Patient ID: Valerie [name], female    DOB: Dec 15, 1955, 61 y.o.   MRN: [id number]  HPI: Valerie [name] is a 61 y.o. female presenting on 05/22/2016 for Back Pain and Hand Pain (3rd digit on R hand. pt took IBU)   HPI  Pt thinks she just needs a new mattress (to help her back pain).  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  No injury to finger. R 3rd finger hurting for about 3 days.  She thinks it is arthritis.  Pt is expecting to have insurance any time now since she applied for it (?obamacare?)  Relevant past medical, surgical, family and social history reviewed and updated as indicated. Interim medical history since our last visit reviewed. Allergies and medications reviewed and updated.   Current Outpatient Prescriptions:  .  atenolol (TENORMIN) 25 MG tablet, TAKE ONE TABLET BY MOUTH ONCE DAILY, Disp: 30 tablet, Rfl: 2 .  montelukast (SINGULAIR) 10 MG tablet, TAKE ONE TABLET BY MOUTH AT BEDTIME, Disp: 30 tablet, Rfl: 3   Review of Systems  Constitutional: Negative for appetite change, chills, diaphoresis, fatigue, fever and unexpected weight change.  HENT: Positive for congestion and sneezing. Negative for dental problem, drooling, ear pain, facial swelling, hearing loss, mouth sores, sore throat, trouble swallowing and voice change.   Eyes: Negative for pain, discharge, redness, itching and visual disturbance.  Respiratory: Negative for cough, choking, shortness of breath and wheezing.   Cardiovascular: Negative for chest pain, palpitations and leg swelling.  Gastrointestinal: Negative for abdominal pain, blood in stool, constipation, diarrhea and vomiting.  Endocrine: Positive for cold  intolerance. Negative for heat intolerance and polydipsia.  Genitourinary: Negative for decreased urine volume, dysuria and hematuria.  Musculoskeletal: Positive for arthralgias and back pain. Negative for gait problem.  Skin: Negative for rash.  Allergic/Immunologic: Positive for environmental allergies.  Neurological: Negative for seizures, syncope, light-headedness and headaches.  Hematological: Negative for adenopathy.  Psychiatric/Behavioral: Positive for dysphoric mood. Negative for agitation and suicidal ideas. The patient is not nervous/anxious.     Per HPI unless specifically indicated above     Objective:    BP 132/72 (BP Location: Left Arm, Patient Position: Sitting, Cuff Size: Normal)   Pulse 69   Temp 97.7 F (36.5 C)   Ht 5' 2.25" (1.581 m)   Wt 125 lb (56.7 kg)   SpO2 98%   BMI 22.68 kg/m   Wt Readings from Last 3 Encounters:  05/22/16 125 lb (56.7 kg)  04/03/16 124 lb (56.2 kg)  12/29/15 121 lb 4 oz (55 kg)    Physical Exam  Constitutional: She is oriented to person, place, and time. She appears well-developed and well-nourished.  HENT:  Head: Normocephalic and atraumatic.  Neck: Neck supple.  Cardiovascular: Normal rate and regular rhythm.   Pulmonary/Chest: Effort normal and breath sounds normal.  Abdominal: Soft. Bowel sounds are normal. She exhibits no mass. There is no hepatosplenomegaly. There is no tenderness.  Musculoskeletal: She exhibits no edema.       Lumbar back: Normal. She exhibits normal range of motion, no tenderness  and no bony tenderness.       Right hand: Normal. She exhibits normal range of motion, no tenderness, no bony tenderness, no deformity and no swelling. Normal strength noted.  Lymphadenopathy:    She has no cervical adenopathy.  Neurological: She is alert and oriented to person, place, and time.  Skin: Skin is warm and dry.  Psychiatric: She has a normal mood and affect. Her behavior is normal.  Vitals reviewed.          Assessment & Plan:   Encounter Diagnoses  Name Primary?  . Chronic low back pain, unspecified back pain laterality, with sciatica presence unspecified Yes  . Finger pain, right     -pt says [place] get injection for her back pain (when she gets her insurance) -counseled pt on gentle back exercises -rx diclofenac to use prn pain.  Can use ice/heat also -avoid overexertion -follow up as scheduled.  Pt to cancel appointment if she gets insurance

## 2016-06-14 ENCOUNTER — Other Ambulatory Visit: Payer: Self-pay | Admitting: Physician Assistant

## 2016-06-14 ENCOUNTER — Telehealth: Payer: Self-pay | Admitting: Student

## 2016-06-14 MED ORDER — DICLOFENAC SODIUM 1 % TD GEL: 2.0000 g | Freq: Four times a day (QID) | TRANSDERMAL | 0 refills | Status: AC | PRN

## 2016-06-14 NOTE — Telephone Encounter (Signed)
Pt called c/o back pain. Pt states she is taking diclofenac given to her for her hand and it is a little helpful . Pt states she has been applying aspercreme, heat, and ice. Pt requesting something she can use topically.  PA suggested she could rx diclofenac gel, but patient is to NOT take diclofenac PO while using topically.  Pt agrees and requests rx to be sent to [facility]. Pt can take tylenol if needed.  Rx and coupon printed and faxed to [facility]. Pt verbalized understanding.

## 2016-07-03 ENCOUNTER — Ambulatory Visit: Payer: Self-pay | Admitting: Physician Assistant

## 2016-08-07 ENCOUNTER — Encounter: Payer: Self-pay | Admitting: Physician Assistant

## 2016-08-07 ENCOUNTER — Ambulatory Visit: Payer: Self-pay | Admitting: Physician Assistant

## 2016-08-07 VITALS — BP 128/80 | HR 77 | Temp 97.7°F | Ht 62.25 in | Wt 129.2 lb

## 2016-08-07 DIAGNOSIS — J302 Other seasonal allergic rhinitis: Secondary | ICD-10-CM

## 2016-08-07 DIAGNOSIS — G8929 Other chronic pain: Secondary | ICD-10-CM

## 2016-08-07 DIAGNOSIS — I10 Essential (primary) hypertension: Secondary | ICD-10-CM

## 2016-08-07 DIAGNOSIS — M545 Low back pain: Secondary | ICD-10-CM

## 2016-08-07 NOTE — Progress Notes (Signed)
BP 128/80 (BP Location: Left Arm, Patient Position: Sitting, Cuff Size: Normal)   Pulse 77   Temp 97.7 F (36.5 C)   Ht 5' 2.25" (1.581 m)   Wt 129 lb 4 oz (58.6 kg)   SpO2 98%   BMI 23.45 kg/m    Subjective:    Patient ID: Valerie [name], female    DOB: 1955/09/26, 61 y.o.   MRN: [id number]  HPI: Valerie [name] is a 61 y.o. female presenting on 08/07/2016 for Hypertension and Diarrhea (pt states it happens every morning for about 3 months. pt states she thinks it may be the coffee she drinks)   HPI  Pt is now working full-time and expects to be getting insurance any day now.   Pt thinks she has diarrhea because of coffee.  Relevant past medical, surgical, family and social history reviewed and updated as indicated. Interim medical history since our last visit reviewed. Allergies and medications reviewed and updated.   Current Outpatient Prescriptions:  .  atenolol (TENORMIN) 25 MG tablet, TAKE ONE TABLET BY MOUTH ONCE DAILY, Disp: 30 tablet, Rfl: 2 .  diclofenac sodium (VOLTAREN) 1 % GEL, Apply 2 g topically every 6 (six) hours as needed., Disp: 1 Tube, Rfl: 0 .  loratadine (CLARITIN) 10 MG tablet, Take 10 mg by mouth daily., Disp: , Rfl:  .  montelukast (SINGULAIR) 10 MG tablet, TAKE ONE TABLET BY MOUTH AT BEDTIME, Disp: 30 tablet, Rfl: 3 .  diclofenac (VOLTAREN) 75 MG EC tablet, Take 1 tablet (75 mg total) by mouth 2 (two) times daily as needed. (Patient not taking: Reported on 08/07/2016), Disp: 60 tablet, Rfl: 0   Review of Systems  Constitutional: Negative for appetite change, chills, diaphoresis, fatigue, fever and unexpected weight change.  HENT: Positive for congestion and sneezing. Negative for dental problem, drooling, ear pain, facial swelling, hearing loss, mouth sores, sore throat, trouble swallowing and voice change.   Eyes: Positive for itching. Negative for pain, discharge, redness and visual disturbance.  Respiratory: Negative for cough, choking,  shortness of breath and wheezing.   Cardiovascular: Negative for chest pain, palpitations and leg swelling.  Gastrointestinal: Positive for constipation and diarrhea. Negative for abdominal pain, blood in stool and vomiting.  Endocrine: Positive for cold intolerance. Negative for heat intolerance and polydipsia.  Genitourinary: Negative for decreased urine volume, dysuria and hematuria.  Musculoskeletal: Positive for arthralgias and back pain. Negative for gait problem.  Skin: Negative for rash.  Allergic/Immunologic: Positive for environmental allergies.  Neurological: Positive for headaches. Negative for seizures, syncope and light-headedness.  Hematological: Negative for adenopathy.  Psychiatric/Behavioral: Negative for agitation, dysphoric mood and suicidal ideas. The patient is not nervous/anxious.     Per HPI unless specifically indicated above     Objective:    BP 128/80 (BP Location: Left Arm, Patient Position: Sitting, Cuff Size: Normal)   Pulse 77   Temp 97.7 F (36.5 C)   Ht 5' 2.25" (1.581 m)   Wt 129 lb 4 oz (58.6 kg)   SpO2 98%   BMI 23.45 kg/m   Wt Readings from Last 3 Encounters:  08/07/16 129 lb 4 oz (58.6 kg)  05/22/16 125 lb (56.7 kg)  04/03/16 124 lb (56.2 kg)    Physical Exam  Constitutional: She is oriented to person, place, and time. She appears well-developed and well-nourished.  HENT:  Head: Normocephalic and atraumatic.  Neck: Neck supple.  Cardiovascular: Normal rate and regular rhythm.   Pulmonary/Chest: Effort normal and breath sounds normal.  Abdominal: Soft. Bowel sounds are normal. She exhibits no mass. There is no hepatosplenomegaly. There is no tenderness.  Musculoskeletal: She exhibits no edema.  Lymphadenopathy:    She has no cervical adenopathy.  Neurological: She is alert and oriented to person, place, and time.  Skin: Skin is warm and dry.  Psychiatric: She has a normal mood and affect. Her behavior is normal.  Vitals  reviewed.   Results for orders placed or performed in visit on 08/18/15  Lipid Profile  Result Value Ref Range   Cholesterol 198 125 - 200 mg/dL   Triglycerides 119 <150 mg/dL   HDL 65 >=46 mg/dL   Total CHOL/HDL Ratio 3.0 <=5.0 Ratio   VLDL 24 <30 mg/dL   LDL Cholesterol 109 <130 mg/dL  COMPLETE METABOLIC PANEL WITH GFR  Result Value Ref Range   Sodium 142 135 - 146 mmol/L   Potassium 4.4 3.5 - 5.3 mmol/L   Chloride 102 98 - 110 mmol/L   CO2 30 20 - 31 mmol/L   Glucose, Bld 93 65 - 99 mg/dL   BUN 14 7 - 25 mg/dL   Creat 0.76 0.50 - 1.05 mg/dL   Total Bilirubin 0.8 0.2 - 1.2 mg/dL   Alkaline Phosphatase 77 33 - 130 U/L   AST 16 10 - 35 U/L   ALT 11 6 - 29 U/L   Total Protein 7.0 6.1 - 8.1 g/dL   Albumin 4.4 3.6 - 5.1 g/dL   Calcium 9.3 8.6 - 10.4 mg/dL   GFR, Est African American >89 >=60 mL/min   GFR, Est Non African American 87 >=60 mL/min  CBC w/Diff/Platelet  Result Value Ref Range   WBC 6.4 3.8 - 10.8 K/uL   RBC 4.31 3.80 - 5.10 MIL/uL   Hemoglobin 13.0 11.7 - 15.5 g/dL   HCT 39.0 35.0 - 45.0 %   MCV 90.5 80.0 - 100.0 fL   MCH 30.2 27.0 - 33.0 pg   MCHC 33.3 32.0 - 36.0 g/dL   RDW 13.7 11.0 - 15.0 %   Platelets 301 140 - 400 K/uL   MPV 9.8 7.5 - 12.5 fL   Neutro Abs 3,968 1,500 - 7,800 cells/uL   Lymphs Abs 1,856 850 - 3,900 cells/uL   Monocytes Absolute 448 200 - 950 cells/uL   Eosinophils Absolute 64 15 - 500 cells/uL   Basophils Absolute 64 0 - 200 cells/uL   Neutrophils Relative % 62 %   Lymphocytes Relative 29 %   Monocytes Relative 7 %   Eosinophils Relative 1 %   Basophils Relative 1 %   Smear Review Criteria for review not met   HgB A1c  Result Value Ref Range   Hgb A1c MFr Bld 5.7 (H) <5.7 %   Mean Plasma Glucose 117 mg/dL  POCT Glucose (CBG)  Result Value Ref Range   POC Glucose 100 (A) 70 - 99 mg/dl  IFOBT POC (occult bld, rslt in office)  Result Value Ref Range   IFOBT Negative       Assessment & Plan:    Encounter Diagnoses  Name  Primary?  . Essential hypertension Yes  . Chronic low back pain, unspecified back pain laterality, with sciatica presence unspecified   . Seasonal allergic rhinitis, unspecified trigger     -discussed with pt that she needs to establish with new PCP as soon as she gets insurance.  Discussed that she should get colonoscopy as soon as she gets her insurance as she is due for colon cancer screening.  Also she is past due for mammogram (pt cancelled multiple appointments we made for her to have the test).  She is to continue current medication.  Encouraged pt to stop drinking coffee to see if that will help her diarrhea

## 2016-08-08 ENCOUNTER — Other Ambulatory Visit: Payer: Self-pay | Admitting: Physician Assistant

## 2016-10-11 DIAGNOSIS — Z1389 Encounter for screening for other disorder: Secondary | ICD-10-CM | POA: Diagnosis not present

## 2016-10-11 DIAGNOSIS — E782 Mixed hyperlipidemia: Secondary | ICD-10-CM | POA: Diagnosis not present

## 2016-10-11 DIAGNOSIS — I10 Essential (primary) hypertension: Secondary | ICD-10-CM | POA: Diagnosis not present

## 2016-10-11 DIAGNOSIS — M79641 Pain in right hand: Secondary | ICD-10-CM | POA: Diagnosis not present

## 2016-10-11 DIAGNOSIS — J302 Other seasonal allergic rhinitis: Secondary | ICD-10-CM | POA: Diagnosis not present

## 2016-10-11 DIAGNOSIS — Z6822 Body mass index (BMI) 22.0-22.9, adult: Secondary | ICD-10-CM | POA: Diagnosis not present

## 2016-10-12 ENCOUNTER — Other Ambulatory Visit (HOSPITAL_COMMUNITY): Payer: Self-pay | Admitting: Registered Nurse

## 2016-10-12 DIAGNOSIS — Z1231 Encounter for screening mammogram for malignant neoplasm of breast: Secondary | ICD-10-CM

## 2016-10-18 ENCOUNTER — Ambulatory Visit (HOSPITAL_COMMUNITY): Payer: Self-pay

## 2016-10-19 DIAGNOSIS — M79642 Pain in left hand: Secondary | ICD-10-CM | POA: Diagnosis not present

## 2016-10-19 DIAGNOSIS — M65331 Trigger finger, right middle finger: Secondary | ICD-10-CM | POA: Diagnosis not present

## 2016-10-19 DIAGNOSIS — M79641 Pain in right hand: Secondary | ICD-10-CM | POA: Diagnosis not present

## 2016-10-26 ENCOUNTER — Ambulatory Visit (HOSPITAL_COMMUNITY): Payer: Self-pay

## 2016-10-26 DIAGNOSIS — F319 Bipolar disorder, unspecified: Secondary | ICD-10-CM | POA: Diagnosis not present

## 2016-10-26 DIAGNOSIS — Z6822 Body mass index (BMI) 22.0-22.9, adult: Secondary | ICD-10-CM | POA: Diagnosis not present

## 2016-10-26 DIAGNOSIS — L255 Unspecified contact dermatitis due to plants, except food: Secondary | ICD-10-CM | POA: Diagnosis not present

## 2016-10-26 DIAGNOSIS — F3160 Bipolar disorder, current episode mixed, unspecified: Secondary | ICD-10-CM | POA: Diagnosis not present

## 2016-11-06 DIAGNOSIS — L509 Urticaria, unspecified: Secondary | ICD-10-CM | POA: Diagnosis not present

## 2016-11-06 DIAGNOSIS — Z1389 Encounter for screening for other disorder: Secondary | ICD-10-CM | POA: Diagnosis not present

## 2016-11-06 DIAGNOSIS — Z6822 Body mass index (BMI) 22.0-22.9, adult: Secondary | ICD-10-CM | POA: Diagnosis not present

## 2016-11-13 DIAGNOSIS — Z111 Encounter for screening for respiratory tuberculosis: Secondary | ICD-10-CM | POA: Diagnosis not present

## 2016-11-20 DIAGNOSIS — Z0000 Encounter for general adult medical examination without abnormal findings: Secondary | ICD-10-CM | POA: Diagnosis not present

## 2017-05-21 DIAGNOSIS — R6889 Other general symptoms and signs: Secondary | ICD-10-CM | POA: Diagnosis not present

## 2017-05-21 DIAGNOSIS — Z1389 Encounter for screening for other disorder: Secondary | ICD-10-CM | POA: Diagnosis not present

## 2017-05-21 DIAGNOSIS — J111 Influenza due to unidentified influenza virus with other respiratory manifestations: Secondary | ICD-10-CM | POA: Diagnosis not present

## 2017-05-21 DIAGNOSIS — Z6821 Body mass index (BMI) 21.0-21.9, adult: Secondary | ICD-10-CM | POA: Diagnosis not present

## 2017-10-11 DIAGNOSIS — R7309 Other abnormal glucose: Secondary | ICD-10-CM | POA: Diagnosis not present

## 2017-10-11 DIAGNOSIS — Z1389 Encounter for screening for other disorder: Secondary | ICD-10-CM | POA: Diagnosis not present

## 2017-10-11 DIAGNOSIS — E782 Mixed hyperlipidemia: Secondary | ICD-10-CM | POA: Diagnosis not present

## 2017-10-11 DIAGNOSIS — Z0000 Encounter for general adult medical examination without abnormal findings: Secondary | ICD-10-CM | POA: Diagnosis not present

## 2017-10-11 DIAGNOSIS — Z6820 Body mass index (BMI) 20.0-20.9, adult: Secondary | ICD-10-CM | POA: Diagnosis not present

## 2017-10-11 DIAGNOSIS — Z01419 Encounter for gynecological examination (general) (routine) without abnormal findings: Secondary | ICD-10-CM | POA: Diagnosis not present

## 2017-10-11 DIAGNOSIS — Z124 Encounter for screening for malignant neoplasm of cervix: Secondary | ICD-10-CM | POA: Diagnosis not present

## 2017-10-11 DIAGNOSIS — R739 Hyperglycemia, unspecified: Secondary | ICD-10-CM | POA: Diagnosis not present

## 2017-10-11 DIAGNOSIS — I10 Essential (primary) hypertension: Secondary | ICD-10-CM | POA: Diagnosis not present

## 2017-10-17 ENCOUNTER — Other Ambulatory Visit (HOSPITAL_COMMUNITY): Payer: Self-pay | Admitting: Family Medicine

## 2017-10-17 DIAGNOSIS — Z1231 Encounter for screening mammogram for malignant neoplasm of breast: Secondary | ICD-10-CM

## 2017-10-29 ENCOUNTER — Encounter (HOSPITAL_COMMUNITY): Payer: Self-pay

## 2017-10-29 ENCOUNTER — Ambulatory Visit (HOSPITAL_COMMUNITY)
Admission: RE | Admit: 2017-10-29 | Discharge: 2017-10-29 | Disposition: A | Discharge status: Home / Self Care | Payer: BLUE CROSS/BLUE SHIELD | Source: Ambulatory Visit | Attending: Family Medicine | Admitting: Family Medicine

## 2017-10-29 DIAGNOSIS — Z1231 Encounter for screening mammogram for malignant neoplasm of breast: Secondary | ICD-10-CM

## 2018-09-23 DIAGNOSIS — Z1159 Encounter for screening for other viral diseases: Secondary | ICD-10-CM | POA: Diagnosis not present

## 2018-09-23 DIAGNOSIS — J309 Allergic rhinitis, unspecified: Secondary | ICD-10-CM | POA: Diagnosis not present

## 2018-11-20 DIAGNOSIS — Z6820 Body mass index (BMI) 20.0-20.9, adult: Secondary | ICD-10-CM | POA: Diagnosis not present

## 2018-11-20 DIAGNOSIS — M1991 Primary osteoarthritis, unspecified site: Secondary | ICD-10-CM | POA: Diagnosis not present

## 2018-11-20 DIAGNOSIS — Z1389 Encounter for screening for other disorder: Secondary | ICD-10-CM | POA: Diagnosis not present

## 2018-11-20 DIAGNOSIS — M19041 Primary osteoarthritis, right hand: Secondary | ICD-10-CM | POA: Diagnosis not present

## 2018-11-20 DIAGNOSIS — I10 Essential (primary) hypertension: Secondary | ICD-10-CM | POA: Diagnosis not present

## 2019-01-07 ENCOUNTER — Other Ambulatory Visit: Payer: Self-pay

## 2019-01-07 DIAGNOSIS — Z20822 Contact with and (suspected) exposure to covid-19: Secondary | ICD-10-CM

## 2019-01-07 DIAGNOSIS — Z20828 Contact with and (suspected) exposure to other viral communicable diseases: Secondary | ICD-10-CM | POA: Diagnosis not present

## 2019-01-09 LAB — NOVEL CORONAVIRUS, NAA: SARS-CoV-2, NAA: NOT DETECTED

## 2019-01-15 ENCOUNTER — Telehealth: Payer: Self-pay | Admitting: Physician Assistant

## 2019-01-15 NOTE — Telephone Encounter (Signed)
Negative COVID results given. Patient results "NOT Detected." Caller expressed understanding. ° °

## 2019-01-21 DIAGNOSIS — F609 Personality disorder, unspecified: Secondary | ICD-10-CM | POA: Diagnosis not present

## 2019-01-21 DIAGNOSIS — F419 Anxiety disorder, unspecified: Secondary | ICD-10-CM | POA: Diagnosis not present

## 2019-01-21 DIAGNOSIS — F329 Major depressive disorder, single episode, unspecified: Secondary | ICD-10-CM | POA: Diagnosis not present

## 2019-01-28 DIAGNOSIS — Z6820 Body mass index (BMI) 20.0-20.9, adult: Secondary | ICD-10-CM | POA: Diagnosis not present

## 2019-01-28 DIAGNOSIS — R7309 Other abnormal glucose: Secondary | ICD-10-CM | POA: Diagnosis not present

## 2019-01-28 DIAGNOSIS — Z681 Body mass index (BMI) 19 or less, adult: Secondary | ICD-10-CM | POA: Diagnosis not present

## 2019-01-28 DIAGNOSIS — F609 Personality disorder, unspecified: Secondary | ICD-10-CM | POA: Diagnosis not present

## 2019-01-28 DIAGNOSIS — F419 Anxiety disorder, unspecified: Secondary | ICD-10-CM | POA: Diagnosis not present

## 2019-01-28 DIAGNOSIS — J310 Chronic rhinitis: Secondary | ICD-10-CM | POA: Diagnosis not present

## 2019-01-28 DIAGNOSIS — J302 Other seasonal allergic rhinitis: Secondary | ICD-10-CM | POA: Diagnosis not present

## 2019-01-28 DIAGNOSIS — F329 Major depressive disorder, single episode, unspecified: Secondary | ICD-10-CM | POA: Diagnosis not present

## 2019-01-28 DIAGNOSIS — J209 Acute bronchitis, unspecified: Secondary | ICD-10-CM | POA: Diagnosis not present

## 2019-02-05 DIAGNOSIS — J302 Other seasonal allergic rhinitis: Secondary | ICD-10-CM | POA: Diagnosis not present

## 2019-02-05 DIAGNOSIS — R7309 Other abnormal glucose: Secondary | ICD-10-CM | POA: Diagnosis not present

## 2019-02-05 DIAGNOSIS — J310 Chronic rhinitis: Secondary | ICD-10-CM | POA: Diagnosis not present

## 2019-02-05 DIAGNOSIS — Z681 Body mass index (BMI) 19 or less, adult: Secondary | ICD-10-CM | POA: Diagnosis not present

## 2019-03-04 ENCOUNTER — Other Ambulatory Visit: Payer: Self-pay

## 2019-03-04 DIAGNOSIS — Z20822 Contact with and (suspected) exposure to covid-19: Secondary | ICD-10-CM

## 2019-03-06 ENCOUNTER — Telehealth: Payer: Self-pay | Admitting: Physician Assistant

## 2019-03-06 LAB — NOVEL CORONAVIRUS, NAA: SARS-CoV-2, NAA: NOT DETECTED

## 2019-03-06 NOTE — Telephone Encounter (Signed)
Negative COVID results given. Patient results "NOT Detected." Caller expressed understanding. ° °

## 2019-03-17 DIAGNOSIS — Z6820 Body mass index (BMI) 20.0-20.9, adult: Secondary | ICD-10-CM | POA: Diagnosis not present

## 2019-03-17 DIAGNOSIS — J209 Acute bronchitis, unspecified: Secondary | ICD-10-CM | POA: Diagnosis not present

## 2019-03-31 ENCOUNTER — Ambulatory Visit: Payer: BC Managed Care – PPO | Attending: Internal Medicine

## 2019-03-31 ENCOUNTER — Other Ambulatory Visit: Payer: Self-pay

## 2019-03-31 DIAGNOSIS — Z20822 Contact with and (suspected) exposure to covid-19: Secondary | ICD-10-CM | POA: Diagnosis not present

## 2019-04-01 LAB — NOVEL CORONAVIRUS, NAA: SARS-CoV-2, NAA: NOT DETECTED

## 2019-04-02 ENCOUNTER — Telehealth: Payer: Self-pay

## 2019-04-02 NOTE — Telephone Encounter (Signed)
Result to be faxed as requested on 04/03/19.

## 2019-04-02 NOTE — Telephone Encounter (Signed)
Patient needs result faxed to [facility] Attn: [facility] rehab manager [phone]

## 2019-04-03 NOTE — Telephone Encounter (Signed)
Left message for patient to return call regarding fax number previously provided. Two failed attempts to fax results were made by Thayer Ohm, [facility] manager. Will need to verify correct fax number.

## 2019-05-09 ENCOUNTER — Other Ambulatory Visit: Payer: Self-pay

## 2019-05-09 ENCOUNTER — Ambulatory Visit
Admission: EM | Admit: 2019-05-09 | Discharge: 2019-05-09 | Disposition: A | Discharge status: Home / Self Care | Payer: BC Managed Care – PPO | Attending: Emergency Medicine | Admitting: Emergency Medicine

## 2019-05-09 DIAGNOSIS — Z1152 Encounter for screening for COVID-19: Secondary | ICD-10-CM | POA: Diagnosis not present

## 2019-05-09 NOTE — Discharge Instructions (Signed)

## 2019-05-09 NOTE — ED Triage Notes (Signed)
Pt needs covid test for new job, doesn't have symptoms

## 2019-05-09 NOTE — ED Provider Notes (Signed)
[facility]    CSN: [id number] Arrival date & time: 05/09/19  1727      History   Chief Complaint No chief complaint on file.   HPI Valerie [name] is a 64 y.o. female.   who presents for COVID testing.  Denies sick exposure to COVID, flu or strep.  Denies recent travel.  Denies aggravating or alleviating symptoms.  Denies previous COVID infection.   Denies fever, chills, fatigue, nasal congestion, rhinorrhea, sore throat, cough, SOB, wheezing, chest pain, nausea, vomiting, changes in bowel or bladder habits.       The history is provided by the patient. No language interpreter was used.    Past Medical History:  Diagnosis Date  . Asthma   . Bipolar disorder (HCC)   . Depression   . Environmental allergies   . Heart murmur 2016  . Hypertension   . Lactose intolerance   . Morton's neuroma of left foot     Patient Active Problem List   Diagnosis Date Noted  . Essential hypertension 08/18/2015  . Bipolar affective disorder, current episode mixed (HCC) 08/18/2015  . Seasonal allergies 08/18/2015  . BIPOLAR AFFECTIVE DISORDER 09/09/2009  . ALLERGIC RHINITIS 09/09/2009  . COUGH 09/09/2009    Past Surgical History:  Procedure Laterality Date  . LAMINECTOMY  2012    OB History   No obstetric history on file.      Home Medications    Prior to Admission medications   Medication Sig Start Date End Date Taking? Authorizing Provider  atenolol (TENORMIN) 25 MG tablet TAKE 1 TABLET BY MOUTH ONCE DAILY 08/08/16   Jacquelin Hawking, PA-C  diclofenac (VOLTAREN) 75 MG EC tablet Take 1 tablet (75 mg total) by mouth 2 (two) times daily as needed. Patient not taking: Reported on 08/07/2016 05/22/16   Jacquelin Hawking, PA-C  diclofenac sodium (VOLTAREN) 1 % GEL Apply 2 g topically every 6 (six) hours as needed. 06/14/16   Jacquelin Hawking, PA-C  loratadine (CLARITIN) 10 MG tablet Take 10 mg by mouth daily.    [provider]  montelukast (SINGULAIR) 10 MG  tablet TAKE ONE TABLET BY MOUTH AT BEDTIME 08/08/16   Jacquelin Hawking, PA-C    Family History Family History  Problem Relation Age of Onset  . Cancer Father        Kidney  . Heart attack Father   . Prostate cancer Father   . Hypertension Father   . Depression Father   . Hypertension Sister   . Hypertension Mother   . Pneumonia Mother   . Asthma Mother   . Diabetes Mother   . Depression Mother   . Hypertension Sister   . Cancer Brother        Prostate Cancer  . Clotting disorder Brother   . Hypertension Brother   . Obesity Brother   . Klinefelter's syndrome Son   . Mental illness Son     Social History Social History   Tobacco Use  . Smoking status: Never Smoker  . Smokeless tobacco: Never Used  Substance Use Topics  . Alcohol use: Yes    Comment: "sometimes" maybe twice a year  . Drug use: No     Allergies   Penicillins, Cephalexin, Ciprofloxacin, Hydrochlorothiazide, Latex, and Methylprednisolone acetate   Review of Systems Review of Systems  Constitutional: Negative.   HENT: Negative.   Respiratory: Negative.   Cardiovascular: Negative.   Gastrointestinal: Negative.   Neurological: Negative.      Physical Exam Triage Vital  Signs ED Triage Vitals  Enc Vitals Group     BP      Pulse      Resp      Temp      Temp src      SpO2      Weight      Height      Head Circumference      Peak Flow      Pain Score      Pain Loc      Pain Edu?      Excl. in GC?    No data found.  Updated Vital Signs BP 121/80   Pulse 88   Temp 98.4 F (36.9 C)   Resp 18   SpO2 95%   Visual Acuity Right Eye Distance:   Left Eye Distance:   Bilateral Distance:    Right Eye Near:   Left Eye Near:    Bilateral Near:     Physical Exam Vitals and nursing note reviewed.  Constitutional:      General: She is not in acute distress.    Appearance: Normal appearance. She is normal weight. She is not ill-appearing or toxic-appearing.  HENT:     Head:  Normocephalic.     Right Ear: Tympanic membrane, ear canal and external ear normal. There is no impacted cerumen.     Left Ear: Tympanic membrane, ear canal and external ear normal. There is no impacted cerumen.     Nose: Nose normal. No congestion.     Mouth/Throat:     Mouth: Mucous membranes are moist.     Pharynx: Oropharynx is clear. No oropharyngeal exudate or posterior oropharyngeal erythema.  Cardiovascular:     Rate and Rhythm: Normal rate and regular rhythm.     Pulses: Normal pulses.     Heart sounds: Normal heart sounds. No murmur.  Pulmonary:     Effort: Pulmonary effort is normal. No respiratory distress.     Breath sounds: Normal breath sounds. No wheezing or rhonchi.  Chest:     Chest wall: No tenderness.  Abdominal:     General: Abdomen is flat. Bowel sounds are normal. There is no distension.     Palpations: There is no mass.     Tenderness: There is no abdominal tenderness.  Skin:    Capillary Refill: Capillary refill takes less than 2 seconds.  Neurological:     General: No focal deficit present.     Mental Status: She is alert and oriented to person, place, and time.      UC Treatments / Results  Labs (all labs ordered are listed, but only abnormal results are displayed) Labs Reviewed  NOVEL CORONAVIRUS, NAA    EKG   Radiology No results found.  Procedures Procedures (including critical care time)  Medications Ordered in UC Medications - No data to display  Initial Impression / Assessment and Plan / UC Course  I have reviewed the triage vital signs and the nursing notes.  Pertinent labs & imaging results that were available during my care of the patient were reviewed by me and considered in my medical decision making (see chart for details).     Encounter for screening for Covid test COVID-19 test was ordered. Advised patient to quarantine Work note was given To go to ED for worsening of symptoms   Final Clinical Impressions(s) / UC  Diagnoses   Final diagnoses:  Encounter for screening for COVID-19     Discharge Instructions  COVID testing ordered.  It will take between 2-7 days for test results.  Someone will contact you regarding abnormal results.    In the meantime: You should remain isolated in your home for 10 days from symptom onset AND greater than 72 hours after symptoms resolution (absence of fever without the use of fever-reducing medication and improvement in respiratory symptoms), whichever is longer Get plenty of rest and push fluids Use medications daily for symptom relief Use OTC medications like ibuprofen or tylenol as needed fever or pain Call or go to the ED if you have any new or worsening symptoms such as fever, worsening cough, shortness of breath, chest tightness, chest pain, turning blue, changes in mental status, etc...     ED Prescriptions    None     PDMP not reviewed this encounter.   Emerson Monte, [place] 05/09/19 1749

## 2019-05-11 LAB — NOVEL CORONAVIRUS, NAA: SARS-CoV-2, NAA: NOT DETECTED

## 2019-05-19 DIAGNOSIS — S39012A Strain of muscle, fascia and tendon of lower back, initial encounter: Secondary | ICD-10-CM | POA: Diagnosis not present

## 2019-05-20 DIAGNOSIS — Z20828 Contact with and (suspected) exposure to other viral communicable diseases: Secondary | ICD-10-CM | POA: Diagnosis not present

## 2019-05-26 DIAGNOSIS — Z20828 Contact with and (suspected) exposure to other viral communicable diseases: Secondary | ICD-10-CM | POA: Diagnosis not present

## 2020-01-06 ENCOUNTER — Other Ambulatory Visit (HOSPITAL_COMMUNITY): Payer: Self-pay | Admitting: Physician Assistant

## 2020-01-06 DIAGNOSIS — Z1231 Encounter for screening mammogram for malignant neoplasm of breast: Secondary | ICD-10-CM

## 2020-05-07 ENCOUNTER — Other Ambulatory Visit (HOSPITAL_COMMUNITY): Payer: Self-pay | Admitting: Family Medicine

## 2020-05-07 DIAGNOSIS — Z1231 Encounter for screening mammogram for malignant neoplasm of breast: Secondary | ICD-10-CM

## 2020-05-24 ENCOUNTER — Ambulatory Visit (HOSPITAL_COMMUNITY): Payer: Self-pay

## 2020-05-27 ENCOUNTER — Ambulatory Visit (HOSPITAL_COMMUNITY): Payer: Self-pay

## 2020-06-03 ENCOUNTER — Ambulatory Visit (HOSPITAL_COMMUNITY)
Admission: RE | Admit: 2020-06-03 | Discharge: 2020-06-03 | Disposition: A | Discharge status: Home / Self Care | Payer: PRIVATE HEALTH INSURANCE | Source: Ambulatory Visit | Attending: Family Medicine | Admitting: Family Medicine

## 2020-06-03 DIAGNOSIS — Z1231 Encounter for screening mammogram for malignant neoplasm of breast: Secondary | ICD-10-CM | POA: Insufficient documentation

## 2020-06-09 ENCOUNTER — Other Ambulatory Visit (HOSPITAL_COMMUNITY): Payer: Self-pay | Admitting: Family Medicine

## 2020-06-09 DIAGNOSIS — R928 Other abnormal and inconclusive findings on diagnostic imaging of breast: Secondary | ICD-10-CM

## 2020-06-15 ENCOUNTER — Encounter (HOSPITAL_COMMUNITY): Payer: Self-pay

## 2020-06-15 ENCOUNTER — Ambulatory Visit (HOSPITAL_COMMUNITY): Payer: Self-pay

## 2020-08-18 ENCOUNTER — Ambulatory Visit
Admission: RE | Admit: 2020-08-18 | Discharge: 2020-08-18 | Disposition: A | Discharge status: Home / Self Care | Payer: PRIVATE HEALTH INSURANCE | Source: Ambulatory Visit | Attending: Family Medicine | Admitting: Family Medicine

## 2020-08-18 ENCOUNTER — Other Ambulatory Visit: Payer: Self-pay

## 2020-08-18 ENCOUNTER — Ambulatory Visit
Admission: RE | Admit: 2020-08-18 | Discharge: 2020-08-18 | Disposition: A | Discharge status: Home / Self Care | Payer: 59 | Source: Ambulatory Visit | Attending: Family Medicine | Admitting: Family Medicine

## 2020-08-18 DIAGNOSIS — R928 Other abnormal and inconclusive findings on diagnostic imaging of breast: Secondary | ICD-10-CM

## 2023-02-05 IMAGING — US US BREAST*L* LIMITED INC AXILLA
1 series · 1 of 1 positions shown · non-contrast
Comparison: Previous exam(s).

CLINICAL DATA: Patient recalled from screening for left breast
asymmetry

EXAM:
DIGITAL DIAGNOSTIC UNILATERAL LEFT MAMMOGRAM WITH TOMOSYNTHESIS AND
CAD; ULTRASOUND LEFT BREAST LIMITED
TECHNIQUE: Left digital diagnostic mammography and breast tomosynthesis was
performed. The images were evaluated with computer-aided detection.;
Targeted ultrasound examination of the left breast was performed

[Series 1: us breast*left* limited inc axilla · 0.06mm/px · 1 of 1 slices shown]
[im 1/1]
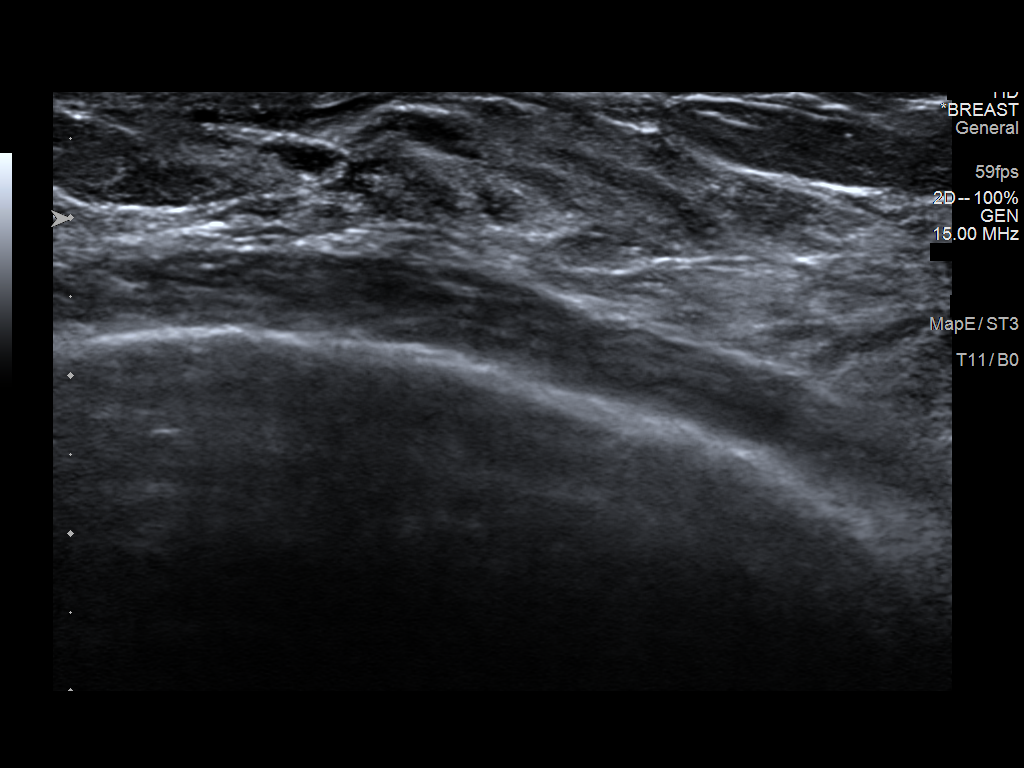

[1 of 1 positions shown; findings below may reference images not displayed]

ACR Breast Density Category c: The breast tissue is heterogeneously
dense, which may obscure small masses.
FINDINGS: Questioned asymmetry within the retroareolar left breast
predominately resolved with additional imaging most compatible with
dense overlapping fibroglandular tissue.

Targeted ultrasound is performed, showing normal tissue without
suspicious mass within the retroareolar left breast.
IMPRESSION: No mammographic evidence for malignancy.

RECOMMENDATION:
Screening mammogram in one year.(Code:F7-O-0GA)

I have discussed the findings and recommendations with the patient.
If applicable, a reminder letter will be sent to the patient
regarding the next appointment.

BI-RADS CATEGORY  1: Negative.
